# Patient Record
Sex: Male | Born: 1970 | Race: Black or African American | Hispanic: No | Marital: Married | Smoking: Current some day smoker
Health system: Southern US, Community
[De-identification: ages and names within clinical notes are randomized; demographics above are authoritative.]

## PROBLEM LIST (undated history)

## (undated) DIAGNOSIS — K279 Peptic ulcer, site unspecified, unspecified as acute or chronic, without hemorrhage or perforation: Secondary | ICD-10-CM

## (undated) DIAGNOSIS — K861 Other chronic pancreatitis: Secondary | ICD-10-CM

## (undated) DIAGNOSIS — I8501 Esophageal varices with bleeding: Secondary | ICD-10-CM

## (undated) HISTORY — DX: Esophageal varices with bleeding: I85.01

## (undated) HISTORY — DX: Peptic ulcer, site unspecified, unspecified as acute or chronic, without hemorrhage or perforation: K27.9

## (undated) HISTORY — DX: Other chronic pancreatitis: K86.1

---

## 2000-02-02 ENCOUNTER — Emergency Department (HOSPITAL_COMMUNITY): Admission: EM | Admit: 2000-02-02 | Discharge: 2000-02-02 | Payer: Self-pay | Admitting: Emergency Medicine

## 2000-02-02 ENCOUNTER — Encounter: Payer: Self-pay | Admitting: Emergency Medicine

## 2000-09-25 ENCOUNTER — Emergency Department (HOSPITAL_COMMUNITY): Admission: EM | Admit: 2000-09-25 | Discharge: 2000-09-25 | Payer: Self-pay | Admitting: Emergency Medicine

## 2000-09-25 ENCOUNTER — Encounter: Payer: Self-pay | Admitting: Emergency Medicine

## 2000-10-19 ENCOUNTER — Emergency Department (HOSPITAL_COMMUNITY): Admission: EM | Admit: 2000-10-19 | Discharge: 2000-10-19 | Payer: Self-pay | Admitting: Emergency Medicine

## 2002-01-19 ENCOUNTER — Encounter: Payer: Self-pay | Admitting: Emergency Medicine

## 2002-01-19 ENCOUNTER — Emergency Department (HOSPITAL_COMMUNITY): Admission: EM | Admit: 2002-01-19 | Discharge: 2002-01-19 | Payer: Self-pay | Admitting: Emergency Medicine

## 2002-02-06 ENCOUNTER — Inpatient Hospital Stay (HOSPITAL_COMMUNITY): Admission: EM | Admit: 2002-02-06 | Discharge: 2002-02-12 | Payer: Self-pay | Admitting: Psychiatry

## 2002-03-07 ENCOUNTER — Emergency Department (HOSPITAL_COMMUNITY): Admission: EM | Admit: 2002-03-07 | Discharge: 2002-03-08 | Payer: Self-pay | Admitting: Emergency Medicine

## 2003-03-18 ENCOUNTER — Encounter: Payer: Self-pay | Admitting: *Deleted

## 2003-03-19 ENCOUNTER — Inpatient Hospital Stay (HOSPITAL_COMMUNITY): Admission: AD | Admit: 2003-03-19 | Discharge: 2003-03-24 | Payer: Self-pay | Admitting: General Surgery

## 2005-02-23 ENCOUNTER — Encounter: Admission: RE | Admit: 2005-02-23 | Discharge: 2005-02-23 | Payer: Self-pay | Admitting: Occupational Medicine

## 2005-03-14 ENCOUNTER — Ambulatory Visit (HOSPITAL_COMMUNITY): Admission: RE | Admit: 2005-03-14 | Discharge: 2005-03-14 | Payer: Self-pay | Admitting: Orthopedic Surgery

## 2005-05-07 ENCOUNTER — Emergency Department (HOSPITAL_COMMUNITY): Admission: EM | Admit: 2005-05-07 | Discharge: 2005-05-07 | Payer: Self-pay | Admitting: Family Medicine

## 2006-02-24 ENCOUNTER — Emergency Department (HOSPITAL_COMMUNITY): Admission: EM | Admit: 2006-02-24 | Discharge: 2006-02-24 | Payer: Self-pay | Admitting: Emergency Medicine

## 2011-09-27 ENCOUNTER — Emergency Department (HOSPITAL_COMMUNITY): Payer: Self-pay

## 2011-09-27 ENCOUNTER — Encounter (HOSPITAL_COMMUNITY): Payer: Self-pay | Admitting: *Deleted

## 2011-09-27 ENCOUNTER — Emergency Department (HOSPITAL_COMMUNITY)
Admission: EM | Admit: 2011-09-27 | Discharge: 2011-09-27 | Disposition: A | Discharge status: Home / Self Care | Payer: Self-pay | Attending: Emergency Medicine | Admitting: Emergency Medicine

## 2011-09-27 DIAGNOSIS — R51 Headache: Secondary | ICD-10-CM | POA: Insufficient documentation

## 2011-09-27 DIAGNOSIS — M25519 Pain in unspecified shoulder: Secondary | ICD-10-CM | POA: Insufficient documentation

## 2011-09-27 DIAGNOSIS — M542 Cervicalgia: Secondary | ICD-10-CM | POA: Insufficient documentation

## 2011-09-27 DIAGNOSIS — R079 Chest pain, unspecified: Secondary | ICD-10-CM | POA: Insufficient documentation

## 2011-09-27 DIAGNOSIS — Y92410 Unspecified street and highway as the place of occurrence of the external cause: Secondary | ICD-10-CM | POA: Insufficient documentation

## 2011-09-27 DIAGNOSIS — T07XXXA Unspecified multiple injuries, initial encounter: Secondary | ICD-10-CM

## 2011-09-27 MED ORDER — OXYCODONE-ACETAMINOPHEN 5-325 MG PO TABS: 1.0000 | ORAL_TABLET | ORAL | Status: AC | PRN

## 2011-09-27 MED ORDER — CYCLOBENZAPRINE HCL 10 MG PO TABS: 10.0000 mg | ORAL_TABLET | Freq: Three times a day (TID) | ORAL | Status: AC | PRN

## 2011-09-27 MED ORDER — OXYCODONE-ACETAMINOPHEN 5-325 MG PO TABS
1.0000 | ORAL_TABLET | Freq: Once | ORAL | Status: AC
  Administered 2011-09-27: 1 via ORAL
  Filled 2011-09-27: qty 1

## 2011-09-27 NOTE — ED Notes (Signed)
Pt requesting rx for muscle relaxant, will speak with EDP
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
## 2012-09-01 ENCOUNTER — Emergency Department (HOSPITAL_COMMUNITY)
Admission: EM | Admit: 2012-09-01 | Discharge: 2012-09-01 | Disposition: A | Discharge status: Home / Self Care | Payer: No Typology Code available for payment source | Attending: Emergency Medicine | Admitting: Emergency Medicine

## 2012-09-01 ENCOUNTER — Emergency Department (HOSPITAL_COMMUNITY): Payer: No Typology Code available for payment source

## 2012-09-01 ENCOUNTER — Encounter (HOSPITAL_COMMUNITY): Payer: Self-pay | Admitting: *Deleted

## 2012-09-01 DIAGNOSIS — S4980XA Other specified injuries of shoulder and upper arm, unspecified arm, initial encounter: Secondary | ICD-10-CM | POA: Insufficient documentation

## 2012-09-01 DIAGNOSIS — R209 Unspecified disturbances of skin sensation: Secondary | ICD-10-CM | POA: Insufficient documentation

## 2012-09-01 DIAGNOSIS — S139XXA Sprain of joints and ligaments of unspecified parts of neck, initial encounter: Secondary | ICD-10-CM | POA: Insufficient documentation

## 2012-09-01 DIAGNOSIS — IMO0002 Reserved for concepts with insufficient information to code with codable children: Secondary | ICD-10-CM | POA: Insufficient documentation

## 2012-09-01 DIAGNOSIS — Y9389 Activity, other specified: Secondary | ICD-10-CM | POA: Insufficient documentation

## 2012-09-01 DIAGNOSIS — S46909A Unspecified injury of unspecified muscle, fascia and tendon at shoulder and upper arm level, unspecified arm, initial encounter: Secondary | ICD-10-CM | POA: Insufficient documentation

## 2012-09-01 DIAGNOSIS — S161XXD Strain of muscle, fascia and tendon at neck level, subsequent encounter: Secondary | ICD-10-CM

## 2012-09-01 DIAGNOSIS — Y92410 Unspecified street and highway as the place of occurrence of the external cause: Secondary | ICD-10-CM | POA: Insufficient documentation

## 2012-09-01 DIAGNOSIS — F17200 Nicotine dependence, unspecified, uncomplicated: Secondary | ICD-10-CM | POA: Insufficient documentation

## 2012-09-01 MED ORDER — NAPROXEN 500 MG PO TABS: 500.0000 mg | ORAL_TABLET | Freq: Two times a day (BID) | ORAL | Status: DC

## 2012-09-01 MED ORDER — OXYCODONE-ACETAMINOPHEN 5-325 MG PO TABS: 1.0000 | ORAL_TABLET | ORAL | Status: DC | PRN
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
## 2012-09-01 NOTE — ED Notes (Signed)
Pt placed in cervical collar in triage for severe posterior neck pain

## 2013-01-04 ENCOUNTER — Emergency Department (HOSPITAL_COMMUNITY): Payer: Self-pay

## 2013-01-04 ENCOUNTER — Encounter (HOSPITAL_COMMUNITY): Payer: Self-pay | Admitting: *Deleted

## 2013-01-04 ENCOUNTER — Emergency Department (HOSPITAL_COMMUNITY)
Admission: EM | Admit: 2013-01-04 | Discharge: 2013-01-04 | Disposition: A | Discharge status: Home / Self Care | Payer: Self-pay | Attending: Emergency Medicine | Admitting: Emergency Medicine

## 2013-01-04 DIAGNOSIS — Z79899 Other long term (current) drug therapy: Secondary | ICD-10-CM | POA: Insufficient documentation

## 2013-01-04 DIAGNOSIS — F17200 Nicotine dependence, unspecified, uncomplicated: Secondary | ICD-10-CM | POA: Insufficient documentation

## 2013-01-04 DIAGNOSIS — IMO0002 Reserved for concepts with insufficient information to code with codable children: Secondary | ICD-10-CM | POA: Insufficient documentation

## 2013-01-04 DIAGNOSIS — S62609A Fracture of unspecified phalanx of unspecified finger, initial encounter for closed fracture: Secondary | ICD-10-CM

## 2013-01-04 MED ORDER — ACETAMINOPHEN 500 MG PO TABS: 500.0000 mg | ORAL_TABLET | Freq: Four times a day (QID) | ORAL | Status: DC | PRN
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
## 2013-01-04 NOTE — Discharge Instructions (Signed)
Finger Fracture  A finger fracture is when one or more bones in the finger break.   HOME CARE    Wear the splint, tape, or cast as long as told by your doctor.   Keep your fingers in the position your doctor tell you to.   Raise (elevate) the injured area above the level of the heart.   Only take medicine as told by your doctor.   Put ice on the injured area.   Put ice in a plastic bag.   Place a towel between the skin and the bag.   Leave the ice on for 15-20 minutes, 3-4 times a day.   Follow up with your doctor.   Ask what exercises you can do when the splint comes off.  GET HELP RIGHT AWAY IF:    The fingernails are white or bluish.   You have pain not helped by medicine.   You cannot move your fingertips.   You lose feeling (numbness) in the injured finger(s).  MAKE SURE YOU:    Understand these instructions.   Will watch this condition.   Will get help right away if you are not doing well or get worse.  Document Released: 11/28/2007 Document Revised: 09/03/2011 Document Reviewed: 11/28/2007  [employer] Patient Information 2014 [employer]

## 2013-01-06 NOTE — ED Provider Notes (Signed)
Medical screening examination/treatment/procedure(s) were performed by non-physician practitioner and as supervising physician I was immediately available for consultation/collaboration.  Derwood Kaplan, MD 01/06/13 [phone]

## 2015-05-17 ENCOUNTER — Encounter: Payer: Self-pay | Admitting: Internal Medicine

## 2015-05-17 ENCOUNTER — Ambulatory Visit (INDEPENDENT_AMBULATORY_CARE_PROVIDER_SITE_OTHER): Payer: Self-pay | Admitting: Internal Medicine

## 2015-05-17 VITALS — BP 119/76 | HR 76 | Temp 98.0°F | Ht 73.0 in | Wt 186.9 lb

## 2015-05-17 DIAGNOSIS — R1084 Generalized abdominal pain: Secondary | ICD-10-CM

## 2015-05-17 DIAGNOSIS — Z7289 Other problems related to lifestyle: Secondary | ICD-10-CM

## 2015-05-17 DIAGNOSIS — Z789 Other specified health status: Secondary | ICD-10-CM | POA: Insufficient documentation

## 2015-05-17 DIAGNOSIS — Z720 Tobacco use: Secondary | ICD-10-CM

## 2015-05-17 DIAGNOSIS — K279 Peptic ulcer, site unspecified, unspecified as acute or chronic, without hemorrhage or perforation: Secondary | ICD-10-CM

## 2015-05-17 DIAGNOSIS — F1020 Alcohol dependence, uncomplicated: Secondary | ICD-10-CM

## 2015-05-17 MED ORDER — SUCRALFATE 1 GM/10ML PO SUSP: 1.0000 g | Freq: Three times a day (TID) | ORAL | Status: DC

## 2015-05-17 MED ORDER — OMEPRAZOLE 40 MG PO CPDR: 40.0000 mg | DELAYED_RELEASE_CAPSULE | Freq: Every day | ORAL | Status: DC

## 2015-05-17 NOTE — Patient Instructions (Addendum)
Your symptoms are concerning for a stomach ulcer or irritation with bleeding. Please take omeprazole 40 mg daily and sucralfate (10 mL) 4 times a day to heal the ulcer. I would also advise cutting back on alcohol and tobacco use. We have referred you to a gastroenterologist and will call you with that appointment time. I will also call you with your lab results and if we need to do any further testing right now.   Peptic Ulcer A peptic ulcer is a sore in the lining of your esophagus (esophageal ulcer), stomach (gastric ulcer), or in the first part of your small intestine (duodenal ulcer). The ulcer causes erosion into the deeper tissue. CAUSES  Normally, the lining of the stomach and the small intestine protects itself from the acid that digests food. The protective lining can be damaged by:  An infection caused by a bacterium called Helicobacter pylori (H. pylori).  Regular use of nonsteroidal anti-inflammatory drugs (NSAIDs), such as ibuprofen or aspirin.  Smoking tobacco. Other risk factors include being older than 50, drinking alcohol excessively, and having a family history of ulcer disease.  SYMPTOMS   Burning pain or gnawing in the area between the chest and the belly button.  Heartburn.  Nausea and vomiting.  Bloating. The pain can be worse on an empty stomach and at night. If the ulcer results in bleeding, it can cause:  Black, tarry stools.  Vomiting of bright red blood.  Vomiting of coffee-ground-looking materials. DIAGNOSIS  A diagnosis is usually made based upon your history and an exam. Other tests and procedures may be performed to find the cause of the ulcer. Finding a cause will help determine the best treatment. Tests and procedures may include:  Blood tests, stool tests, or breath tests to check for the bacterium H. pylori.  An upper gastrointestinal (GI) series of the esophagus, stomach, and small intestine.  An endoscopy to examine the esophagus, stomach, and  small intestine.  A biopsy. TREATMENT  Treatment may include:  Eliminating the cause of the ulcer, such as smoking, NSAIDs, or alcohol.  Medicines to reduce the amount of acid in your digestive tract.  Antibiotic medicines if the ulcer is caused by the H. pylori bacterium.  An upper endoscopy to treat a bleeding ulcer.  Surgery if the bleeding is severe or if the ulcer created a hole somewhere in the digestive system. HOME CARE INSTRUCTIONS   Avoid tobacco, alcohol, and caffeine. Smoking can increase the acid in the stomach, and continued smoking will impair the healing of ulcers.  Avoid foods and drinks that seem to cause discomfort or aggravate your ulcer.  Only take medicines as directed by your caregiver. Do not substitute over-the-counter medicines for prescription medicines without talking to your caregiver.  Keep any follow-up appointments and tests as directed. SEEK MEDICAL CARE IF:   Your do not improve within 7 days of starting treatment.  You have ongoing indigestion or heartburn. SEEK IMMEDIATE MEDICAL CARE IF:   You have sudden, sharp, or persistent abdominal pain.  You have bloody or dark black, tarry stools.  You vomit blood or vomit that looks like coffee grounds.  You become light-headed, weak, or feel faint.  You become sweaty or clammy. MAKE SURE YOU:   Understand these instructions.  Will watch your condition.  Will get help right away if you are not doing well or get worse.   This information is not intended to replace advice given to you by your health care provider. Make sure  you discuss any questions you have with your health care provider.   Document Released: 06/08/2000 Document Revised: 07/02/2014 Document Reviewed: 01/09/2012 Elsevier Interactive Patient Education [employer]

## 2015-05-17 NOTE — Assessment & Plan Note (Signed)
Patient admits to 6 pack of beer per day for 25 years. Denies needing an eye opener. Does not feel he is dependent on alcohol.  Plan: -Counseled on cutting back to 2 drinks per day

## 2015-05-17 NOTE — Assessment & Plan Note (Signed)
  Assessment: Progress toward smoking cessation:   Stagnant Barriers to progress toward smoking cessation:   Desire Comments: 1/2 ppd for 30 years  Plan: Instruction/counseling given:  I counseled patient on the dangers of tobacco use, advised patient to stop smoking, and reviewed strategies to maximize success. Medications to assist with smoking cessation:  None Patient agreed to the following self-care plans for smoking cessation: cut down the number of cigarettes smoked

## 2015-05-17 NOTE — Progress Notes (Signed)
   Subjective:    Patient ID: Roger Monroe, male    DOB: 1971-01-07, 44 y.o.   MRN: [id number]  HPI Roger Monroe is a 44 y.o. male with PMHx of tobacco abuse, alcohol abuse, GERD who presents to the clinic for abdominal pain. Please see A&P for the status of the patient's chronic medical problems.   No past medical history on file.  Outpatient Encounter Prescriptions as of 05/17/2015  Medication Sig  . acetaminophen (TYLENOL) 500 MG tablet Take 1 tablet (500 mg total) by mouth every 6 (six) hours as needed for pain.  . IRON PO Take 1 tablet by mouth daily.  . Melatonin 5 MG TABS Take 1 tablet by mouth at bedtime as needed. For sleep  . Multiple Vitamins-Minerals (MULTIVITAMIN WITH MINERALS) tablet Take 1 tablet by mouth daily.   No facility-administered encounter medications on file as of 05/17/2015.    No family history on file.  Social History   Social History  . Marital Status: Single    Spouse Name: N/A  . Number of Children: N/A  . Years of Education: N/A   Occupational History  . Not on file.   Social History Main Topics  . Smoking status: Current Every Day Smoker -- 0.50 packs/day for 30 years    Types: Cigarettes  . Smokeless tobacco: Not on file  . Alcohol Use: 0.0 oz/week    0 Standard drinks or equivalent per week     Comment: Beer.  . Drug Use: Yes     Comment: Marijuana.  . Sexual Activity: Not on file   Other Topics Concern  . Not on file   Social History Narrative   Review of Systems General: Admits to night sweats, denies weight loss. Denies fever, chills, fatigue, change in appetite.  Respiratory: Denies SOB, cough, DOE.   Cardiovascular: Denies chest pain and palpitations.  Gastrointestinal: Admits to nausea, vomiting, GERD, abdominal pain, hematemesis, BRBPR. Denies diarrhea, constipation, black tarry stools and abdominal distention.  Musculoskeletal: Denies myalgias, back pain.  Skin: Denies skin yellowing, rash.  Neurological: Denies  dizziness, headaches, weakness, lightheadedness     Objective:   Physical Exam Filed Vitals:   05/17/15 0826  BP: 119/76  Pulse: 76  Temp: 98 F (36.7 C)  TempSrc: Oral  Height: 6\' 1"  (1.854 m)  Weight: 186 lb 14.4 oz (84.777 kg)  SpO2: 98%   General: Vital signs reviewed.  Patient is well-developed and well-nourished, in no acute distress and cooperative with exam.  HEENT: No evidence of scleral icterus, no cervical lymphadenopathy, normal posterior oropharynx. Cardiovascular: RRR, S1 normal, S2 normal, no murmurs, gallops, or rubs. Pulmonary/Chest: Clear to auscultation bilaterally, no wheezes, rales, or rhonchi. Abdominal: Soft, mildly tender in all four quadrants, worst in epigastric/periumbilical region, non-distended, BS +, no masses, organomegaly, or guarding present.  Extremities: No lower extremity edema bilaterally Neurological: A&O x3 Skin: Warm, dry and intact. No evidence of juandice.  Psychiatric: Normal mood and affect. speech and behavior is normal. Cognition and memory are normal.      Assessment & Plan:   Please see problem based assessment and plan.

## 2015-05-17 NOTE — Assessment & Plan Note (Addendum)
Patient presents with a 2 year history of intermittent abdominal pain. Pain is diffuse, but located primarily in the epigastric, RUQ and LUQ regions. Patient experiences this "cramp-y", sharp pain twice a week, every week and the episodes seem to last for about one day each time. Pain radiates to his back and left side. During the episodes, he tries to "walk off" the pain or lay down. He has tried tylenol in the past without relief. The pain will resolve on its own each time. He hasn't noticed anything that make the pain worse or brings the pain on. "It just happens." Pain is not relieved or increased with food intake. He has been able to eat normally. At its worst, pain is a 10/10, but when it resolves, he is a 0/10. This pain is associated with nausea and vomiting. He vomits 2-3x/week, every week. He will occasional notice a good amount of blood, but this has only happened 3-4 times in the last 2 years. The last time he experienced this was 2 weeks ago. He is unable to quantify how much blood. Pain is not associated with diarrhea or constipation, but he does occasionally see BRBPR. He denies any black, dark or tarry stools. ROS is also positive for night sweats without weight loss and acid reflux. He denies skin or eye yellowing, dysphagia. Of note, he smoked 1/2 ppd for the last 30 years and drinks a 6 pack per day of beer for the last 25 years. He denies NSAID use. He has used cocaine in the past and smokes marijuana occasionally.   Differential diagnosis includes PUD with bleeding, erosive gastritis, erosive esophagitis, chronic pancreatitis, and esophageal varices with bleeding. Risk factors include uncontrolled GERD, tobacco and alcohol abuse. Malignancy should be on the differential given night sweats.   Plan: -Omeprazole 40 mg daily -Sucralfate 10 mL QID -Referral to GI for EGD -CBC, CMET, Lipase -Consider further imaging with abdominal US based on above results -Avoid NSAIDs, cut back on alcohol  and tobacco use  Addendum: -CMET and Lipase normal -Hemoglobin 16.1 -WBC mildly elevated at 12

## 2015-05-18 LAB — CBC
Hematocrit: 48.8 % (ref 37.5–51.0)
Hemoglobin: 16.1 g/dL (ref 12.6–17.7)
MCH: 28.8 pg (ref 26.6–33.0)
MCHC: 33 g/dL (ref 31.5–35.7)
MCV: 87 fL (ref 79–97)
Platelets: 340 10*3/uL (ref 150–379)
RBC: 5.6 x10E6/uL (ref 4.14–5.80)
RDW: 15.4 % (ref 12.3–15.4)
WBC: 12.8 10*3/uL — ABNORMAL HIGH (ref 3.4–10.8)

## 2015-05-18 LAB — CMP14 + ANION GAP
ALT: 14 IU/L (ref 0–44)
AST: 14 IU/L (ref 0–40)
Albumin/Globulin Ratio: 1.6 (ref 1.1–2.5)
Albumin: 4.1 g/dL (ref 3.5–5.5)
Alkaline Phosphatase: 66 IU/L (ref 39–117)
Anion Gap: 15 mmol/L (ref 10.0–18.0)
BUN/Creatinine Ratio: 9 (ref 9–20)
BUN: 9 mg/dL (ref 6–24)
Bilirubin Total: 0.3 mg/dL (ref 0.0–1.2)
CO2: 24 mmol/L (ref 18–29)
Calcium: 9.5 mg/dL (ref 8.7–10.2)
Chloride: 103 mmol/L (ref 97–106)
Creatinine, Ser: 1.02 mg/dL (ref 0.76–1.27)
GFR calc Af Amer: 101 mL/min/{1.73_m2} (ref >59)
GFR calc non Af Amer: 88 mL/min/{1.73_m2} (ref >59)
Globulin, Total: 2.5 g/dL (ref 1.5–4.5)
Glucose: 88 mg/dL (ref 65–99)
Potassium: 4.8 mmol/L (ref 3.5–5.2)
Sodium: 142 mmol/L (ref 136–144)
Total Protein: 6.6 g/dL (ref 6.0–8.5)

## 2015-05-18 LAB — LIPASE: Lipase: 31 U/L (ref 0–59)

## 2015-05-18 NOTE — Progress Notes (Signed)
Internal Medicine Clinic Attending  Case discussed with Dr. Richardson at the time of the visit.  We reviewed the resident's history and exam and pertinent patient test results.  I agree with the assessment, diagnosis, and plan of care documented in the resident's note. 

## 2015-06-14 ENCOUNTER — Encounter: Payer: Self-pay | Admitting: Internal Medicine

## 2015-06-14 ENCOUNTER — Ambulatory Visit: Payer: Self-pay | Admitting: Internal Medicine

## 2015-07-12 ENCOUNTER — Ambulatory Visit: Payer: Self-pay | Admitting: Internal Medicine

## 2015-07-13 NOTE — Addendum Note (Signed)
Addended by: Ilda Basset A on: 07/13/2015 11:39 AM   Modules accepted: Orders

## 2018-10-01 ENCOUNTER — Encounter (HOSPITAL_BASED_OUTPATIENT_CLINIC_OR_DEPARTMENT_OTHER): Payer: Self-pay | Admitting: *Deleted

## 2018-10-01 ENCOUNTER — Other Ambulatory Visit: Payer: Self-pay

## 2018-10-01 ENCOUNTER — Emergency Department (HOSPITAL_BASED_OUTPATIENT_CLINIC_OR_DEPARTMENT_OTHER): Payer: Self-pay

## 2018-10-01 ENCOUNTER — Emergency Department (HOSPITAL_BASED_OUTPATIENT_CLINIC_OR_DEPARTMENT_OTHER)
Admission: EM | Admit: 2018-10-01 | Discharge: 2018-10-01 | Disposition: A | Discharge status: Home / Self Care | Payer: Self-pay | Attending: Emergency Medicine | Admitting: Emergency Medicine

## 2018-10-01 DIAGNOSIS — Y92000 Kitchen of unspecified non-institutional (private) residence as  the place of occurrence of the external cause: Secondary | ICD-10-CM | POA: Insufficient documentation

## 2018-10-01 DIAGNOSIS — Z79899 Other long term (current) drug therapy: Secondary | ICD-10-CM | POA: Insufficient documentation

## 2018-10-01 DIAGNOSIS — Y999 Unspecified external cause status: Secondary | ICD-10-CM | POA: Insufficient documentation

## 2018-10-01 DIAGNOSIS — Z87891 Personal history of nicotine dependence: Secondary | ICD-10-CM | POA: Insufficient documentation

## 2018-10-01 DIAGNOSIS — S61211A Laceration without foreign body of left index finger without damage to nail, initial encounter: Secondary | ICD-10-CM | POA: Insufficient documentation

## 2018-10-01 DIAGNOSIS — W260XXA Contact with knife, initial encounter: Secondary | ICD-10-CM | POA: Insufficient documentation

## 2018-10-01 DIAGNOSIS — Y939 Activity, unspecified: Secondary | ICD-10-CM | POA: Insufficient documentation

## 2018-10-01 MED ORDER — LIDOCAINE HCL 2 % IJ SOLN
INTRAMUSCULAR | Status: AC
  Filled 2018-10-01: qty 20

## 2018-10-01 MED ORDER — ACETAMINOPHEN 325 MG PO TABS
650.0000 mg | ORAL_TABLET | Freq: Once | ORAL | Status: AC
  Administered 2018-10-01: 19:00:00 650 mg via ORAL
  Filled 2018-10-01: qty 2

## 2018-10-01 NOTE — Discharge Instructions (Addendum)
Please read instructions below.  Keep your wound clean and covered. In 24 hours, you can get your wound wet; gently clean it with soap and water, pat it dry, and reapply a clean bandage. You can take tylenol every 4 hours as needed for pain Follow up with your primary care or urgent care for wound recheck in 7 days.  Return to the ER for fever, pus draining from wound, redness, or new or worsening symptoms.

## 2018-10-01 NOTE — ED Provider Notes (Signed)
[facility] EMERGENCY DEPARTMENT Provider Note   CSN: [id number] Arrival date & time: 10/01/18  1804    History   Chief Complaint Chief Complaint  Patient presents with   Extremity Laceration    HPI Roger Monroe is a 48 y.o. male presenting to the emergency department with complaint of acute onset of laceration to left index finger that occurred about 5:30 this evening.  Patient states he was at work and was wiping a knife off with a towel, however the towel slipped and he cut the palmar aspect of his distal left index finger.    He has had some bleeding since that time and associated localized pain.  Denies history of immunocompromise or diabetes.  No other injuries.  Tetanus is up-to-date.  The history is provided by the patient.    Past Medical History:  Diagnosis Date   Chronic pancreatitis (HCC)    Esophageal varices with bleeding (HCC)    PUD (peptic ulcer disease)     Patient Active Problem List   Diagnosis Date Noted   PUD (peptic ulcer disease) 05/17/2015   Tobacco abuse 05/17/2015   Alcohol consumption of more than four drinks per day 05/17/2015    History reviewed. No pertinent surgical history.      Home Medications    Prior to Admission medications   Medication Sig Start Date End Date Taking? Authorizing Provider  acetaminophen (TYLENOL) 500 MG tablet Take 1 tablet (500 mg total) by mouth every 6 (six) hours as needed for pain. 01/04/13   Lurene ShadowPhelps, Erin O, PA-C  Melatonin 5 MG TABS Take 1 tablet by mouth at bedtime as needed. For sleep    [provider]  Multiple Vitamins-Minerals (MULTIVITAMIN WITH MINERALS) tablet Take 1 tablet by mouth daily.    [provider]  omeprazole (PRILOSEC) 40 MG capsule Take 1 capsule (40 mg total) by mouth daily. 05/17/15   Burns, Tinnie GensAlexa R, MD  sucralfate (CARAFATE) 1 GM/10ML suspension Take 10 mLs (1 g total) by mouth 4 (four) times daily -  with meals and at bedtime. 05/17/15   Servando SnareBurns, Alexa  R, MD    Family History History reviewed. No pertinent family history.  Social History Social History   Tobacco Use   Smoking status: Former Smoker    Packs/day: 0.50    Years: 30.00    Pack years: 15.00    Types: Cigarettes   Smokeless tobacco: Never Used  Substance Use Topics   Alcohol use: Yes    Alcohol/week: 4.0 standard drinks    Types: 4 Cans of beer per week    Comment: Beer.   Drug use: Yes    Types: Marijuana    Comment: Marijuana.     Allergies   Ibuprofen   Review of Systems Review of Systems  Musculoskeletal: Positive for myalgias.  Skin: Positive for wound.  Allergic/Immunologic: Negative for immunocompromised state.     Physical Exam Updated Vital Signs BP (!) 140/104 (BP Location: Right Arm)    Pulse 81    Temp 98.4 F (36.9 C) (Oral)    Resp 14    Ht 6' 1.5" (1.867 m)    Wt 94.8 kg    SpO2 99%    BMI 27.20 kg/m   Physical Exam Vitals signs and nursing note reviewed.  Constitutional:      General: He is not in acute distress.    Appearance: He is well-developed.  HENT:     Head: Normocephalic and atraumatic.  Eyes:  Conjunctiva/sclera: Conjunctivae normal.  Cardiovascular:     Rate and Rhythm: Normal rate.  Pulmonary:     Effort: Pulmonary effort is normal.  Musculoskeletal:     Comments: 3 cm laceration to the palmar aspect of the left distal phalanx of the index finger.  Wound is actively bleeding, however improved with direct pressure.  Wound is not grossly contaminated. Patient is able to flex DIP against resistance with good strength.  Distal digit is warm and well-perfused.    Neurological:     Mental Status: He is alert.  Psychiatric:        Mood and Affect: Mood normal.        Behavior: Behavior normal.      ED Treatments / Results  Labs (all labs ordered are listed, but only abnormal results are displayed) Labs Reviewed - No data to display  EKG None  Radiology Dg Finger Index Left  Result Date:  10/01/2018 CLINICAL DATA:  Laceration. EXAM: LEFT INDEX FINGER 2+V COMPARISON:  None. FINDINGS: There is no evidence of fracture or dislocation. Minor degenerative change of the proximal interphalangeal joint. A dressing overlies the volar radial aspect of the distal digit. There is no radiopaque foreign body or tracking soft tissue air. IMPRESSION: No radiopaque foreign body or acute osseous abnormality. Electronically Signed   By: Narda Rutherford M.D.   On: 10/01/2018 19:15    Procedures .Marland KitchenLaceration Repair Date/Time: 10/01/2018 8:21 PM Performed by: Lakesha Levinson, [place] N, PA-C Authorized by: Jeneane Pieczynski, [place] N, PA-C   Consent:    Consent obtained:  Verbal   Consent given by:  Patient   Risks discussed:  Infection, pain and poor cosmetic result   Alternatives discussed:  No treatment Anesthesia (see MAR for exact dosages):    Anesthesia method:  Nerve block   Block needle gauge:  25 G   Block anesthetic:  Lidocaine 2% w/o epi   Block injection procedure:  Anatomic landmarks palpated   Block outcome:  Anesthesia achieved Laceration details:    Location:  Finger   Finger location:  L index finger   Length (cm):  3 Repair type:    Repair type:  Simple Pre-procedure details:    Preparation:  Patient was prepped and draped in usual sterile fashion and imaging obtained to evaluate for foreign bodies Exploration:    Hemostasis achieved with:  Direct pressure   Wound exploration: wound explored through full range of motion and entire depth of wound probed and visualized     Wound extent: no foreign bodies/material noted, no tendon damage noted and no underlying fracture noted     Contaminated: no   Treatment:    Area cleansed with:  Saline   Amount of cleaning:  Standard   Visualized foreign bodies/material removed: no   Skin repair:    Repair method:  Sutures   Suture size:  5-0   Suture material:  Prolene   Suture technique:  Simple interrupted   Number of sutures:  7 Approximation:     Approximation:  Close Post-procedure details:    Dressing:  Non-adherent dressing and splint for protection   Patient tolerance of procedure:  Tolerated well, no immediate complications   (including critical care time)  Medications Ordered in ED Medications  lidocaine (XYLOCAINE) 2 % (with pres) injection (has no administration in time range)  acetaminophen (TYLENOL) tablet 650 mg (650 mg Oral Given 10/01/18 1850)     Initial Impression / Assessment and Plan / ED Course  I have reviewed the  triage vital signs and the nursing notes.  Pertinent labs & imaging results that were available during my care of the patient were reviewed by me and considered in my medical decision making (see chart for details).        Pt with laceration to the left index finger that occurred when he was cleaning a kitchen knife today. Preserved active and resistive ROM of the digit. Wound explored and base of wound visualized in a bloodless field without evidence of foreign body or tendon injury. Laceration occurred < 8 hours prior to repair which was well tolerated.  Tdap up-to-date.  Pt has no comorbidities to effect normal wound healing. Pt discharged  without antibiotics.  Discussed suture home care with patient and answered questions. Pt to follow-up for wound check and suture removal in 7 days; they are to return to the ED sooner for signs of infection. Pt is hemodynamically stable with no complaints prior to dc.   Discussed results, findings, treatment and follow up. Patient advised of return precautions. Patient verbalized understanding and agreed with plan.  Final Clinical Impressions(s) / ED Diagnoses   Final diagnoses:  Laceration of left index finger without foreign body without damage to nail, initial encounter    ED Discharge Orders    None       Remi Lopata, [place] N, PA-C 10/01/18 2023    Gwyneth Sprout, MD 10/01/18 2230

## 2018-10-01 NOTE — ED Triage Notes (Signed)
Pt /o lac to left index finger by kitchen knife x 30 mins ago

## 2018-10-08 ENCOUNTER — Encounter (HOSPITAL_BASED_OUTPATIENT_CLINIC_OR_DEPARTMENT_OTHER): Payer: Self-pay | Admitting: *Deleted

## 2018-10-08 ENCOUNTER — Emergency Department (HOSPITAL_BASED_OUTPATIENT_CLINIC_OR_DEPARTMENT_OTHER)
Admission: EM | Admit: 2018-10-08 | Discharge: 2018-10-08 | Disposition: A | Discharge status: Home / Self Care | Payer: Self-pay | Attending: Emergency Medicine | Admitting: Emergency Medicine

## 2018-10-08 ENCOUNTER — Other Ambulatory Visit: Payer: Self-pay

## 2018-10-08 DIAGNOSIS — Z87891 Personal history of nicotine dependence: Secondary | ICD-10-CM | POA: Insufficient documentation

## 2018-10-08 DIAGNOSIS — Z79899 Other long term (current) drug therapy: Secondary | ICD-10-CM | POA: Insufficient documentation

## 2018-10-08 DIAGNOSIS — Z4802 Encounter for removal of sutures: Secondary | ICD-10-CM | POA: Insufficient documentation

## 2018-10-08 NOTE — ED Triage Notes (Signed)
Pt here  for suture removal seen 4/8

## 2018-10-08 NOTE — ED Notes (Signed)
First contact with patient; self and role introduced.  No one at bedside with pt.  Pt with existing 2cm resolving lac to L index finger. In today to have sutures removed . Site CDI, skin well approximated with one edge slightly oozing. Provider in room assessed, removed sutures, cleaned area, and dressed finger.  Pt tol well.  RRR regular, even, and unlabored. Skin warm, dry, color appropriate. No visible s/s of skin breakdown at this time.  No apparent physical distress.  Provider to DC pt. Pt agreeable.  Bed is in low, locked position. Call light within reach.  All needs and concerns addressed prior to leaving room.

## 2018-10-08 NOTE — ED Provider Notes (Signed)
[facility] EMERGENCY DEPARTMENT Provider Note   CSN: [id number] Arrival date & time: 10/08/18  1511    History   Chief Complaint Chief Complaint  Patient presents with  . Suture / Staple Removal    HPI Roger Monroe is a 47 y.o. male.     HPI Patient here for suture removal.  Left index finger.  No problems with increased pain drainage or redness. Past Medical History:  Diagnosis Date  . Chronic pancreatitis (HCC)   . Esophageal varices with bleeding (HCC)   . PUD (peptic ulcer disease)     Patient Active Problem List   Diagnosis Date Noted  . PUD (peptic ulcer disease) 05/17/2015  . Tobacco abuse 05/17/2015  . Alcohol consumption of more than four drinks per day 05/17/2015    History reviewed. No pertinent surgical history.      Home Medications    Prior to Admission medications   Medication Sig Start Date End Date Taking? Authorizing Provider  acetaminophen (TYLENOL) 500 MG tablet Take 1 tablet (500 mg total) by mouth every 6 (six) hours as needed for pain. 01/04/13   Lurene Shadow, PA-C  Melatonin 5 MG TABS Take 1 tablet by mouth at bedtime as needed. For sleep    [provider]  Multiple Vitamins-Minerals (MULTIVITAMIN WITH MINERALS) tablet Take 1 tablet by mouth daily.    [provider]  omeprazole (PRILOSEC) 40 MG capsule Take 1 capsule (40 mg total) by mouth daily. 05/17/15   Burns, Tinnie Gens, MD  sucralfate (CARAFATE) 1 GM/10ML suspension Take 10 mLs (1 g total) by mouth 4 (four) times daily -  with meals and at bedtime. 05/17/15   Servando Snare, MD    Family History History reviewed. No pertinent family history.  Social History Social History   Tobacco Use  . Smoking status: Former Smoker    Packs/day: 0.50    Years: 30.00    Pack years: 15.00    Types: Cigarettes  . Smokeless tobacco: Never Used  Substance Use Topics  . Alcohol use: Yes    Alcohol/week: 4.0 standard drinks    Types: 4 Cans of beer per week   Comment: Beer.  . Drug use: Yes    Types: Marijuana    Comment: Marijuana.     Allergies   Ibuprofen   Review of Systems Review of Systems Constitutional: No fever chills malaise Musculoskeletal: No redness. swelling  Physical Exam Updated Vital Signs BP (!) 163/104   Pulse 99   Temp 98.6 F (37 C)   Resp 16   Ht 6\' 1"  (1.854 m)   Wt 94 kg   SpO2 99%   BMI 27.34 kg/m   Physical Exam Constitutional:      Comments: Alert and clinically well in appearance.  Pulmonary:     Effort: Pulmonary effort is normal.  Musculoskeletal: Normal range of motion.     Comments: Left index finger has semi-elliptical laceration on the pad with 7 sutures present.  Healing well without redness or swelling of the finger.  No drainage or discharge from the wound.  Lean dry and intact.  Skin:    General: Skin is warm and dry.  Neurological:     General: No focal deficit present.     Mental Status: He is oriented to person, place, and time.     Coordination: Coordination normal.  Psychiatric:        Mood and Affect: Mood normal.      ED Treatments /  Results  Labs (all labs ordered are listed, but only abnormal results are displayed) Labs Reviewed - No data to display  EKG None  Radiology No results found.  Procedures .Suture Removal Date/Time: 10/08/2018 3:32 PM Performed by: Arby BarrettePfeiffer, Laurinda Carreno, MD Authorized by: Arby BarrettePfeiffer, Carlis Blanchard, MD   Consent:    Consent obtained:  Verbal   Consent given by:  Patient   Risks discussed:  Bleeding, pain and wound separation Location:    Location:  Upper extremity   Upper extremity location:  Hand   Hand location:  L index finger Procedure details:    Wound appearance:  No signs of infection, good wound healing and clean   Number of sutures removed:  7 Post-procedure details:    Post-removal:  Antibiotic ointment applied and dressing applied   Patient tolerance of procedure:  Tolerated well, no immediate complications Comments:     Wound  is clean dry well approximated.  Approximately a 3 mm section with minimal separation and slight bleeding with removal of suture.  Appropriate and amenable to continue topical wound care and protection.   (including critical care time)  Medications Ordered in ED Medications - No data to display   Initial Impression / Assessment and Plan / ED Course  I have reviewed the triage vital signs and the nursing notes.  Pertinent labs & imaging results that were available during my care of the patient were reviewed by me and considered in my medical decision making (see chart for details).         Final Clinical Impressions(s) / ED Diagnoses   Final diagnoses:  Visit for suture removal    ED Discharge Orders    None       Arby BarrettePfeiffer, Sterling Ucci, MD 10/08/18 1533

## 2018-10-08 NOTE — ED Notes (Addendum)
Finger splint applied per orders.   Patient reviewed and given discharge instructions and work release note. Questions or concerns with discharge instructions addressed, no further questions noted. Verbalized understanding of instructions. Patient remains alert and oriented. Respirations even unlabored. No acute distress noted. No IV placed during this visit. All belongings with patient upon discharge. Patient walked to lobby at DC.

## 2018-10-08 NOTE — Discharge Instructions (Signed)
1.  Change your dressing daily and put antibiotic ointment on your wound.  Keep your wound covered and wear finger splint to protect the wound and minimize bending of the tip of your finger while it is still healing. 2.  Return to the emergency department if signs of infection such as pain swelling redness or drainage.

## 2019-03-27 ENCOUNTER — Other Ambulatory Visit: Payer: Self-pay

## 2019-03-27 DIAGNOSIS — Z20822 Contact with and (suspected) exposure to covid-19: Secondary | ICD-10-CM

## 2019-03-28 LAB — NOVEL CORONAVIRUS, NAA: SARS-CoV-2, NAA: NOT DETECTED

## 2019-11-04 DIAGNOSIS — I10 Essential (primary) hypertension: Secondary | ICD-10-CM | POA: Insufficient documentation

## 2020-01-28 IMAGING — CR LEFT INDEX FINGER 2+V
3 series · 3 of 3 positions shown · non-contrast
Comparison: None.

CLINICAL DATA: Laceration.

EXAM:
LEFT INDEX FINGER 2+V

[x finger pa left]
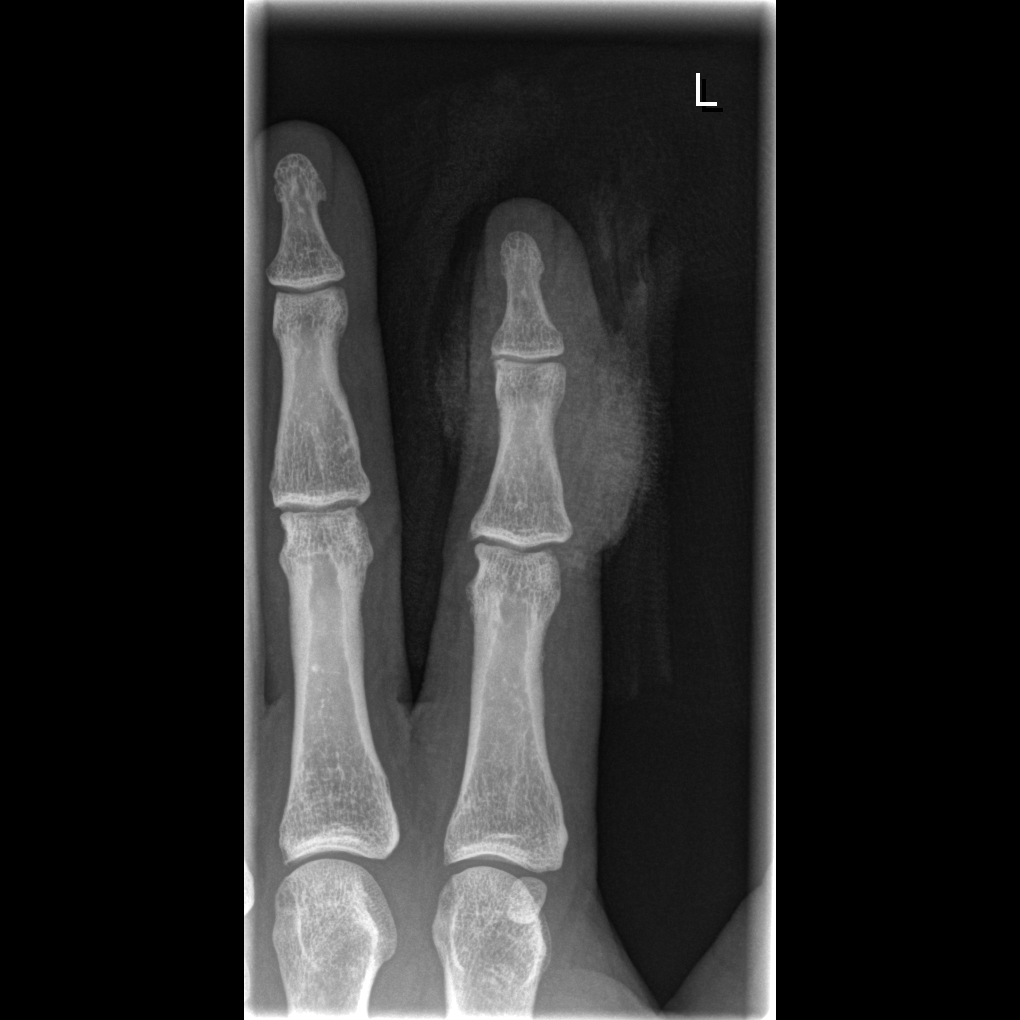

[x finger obl. left]
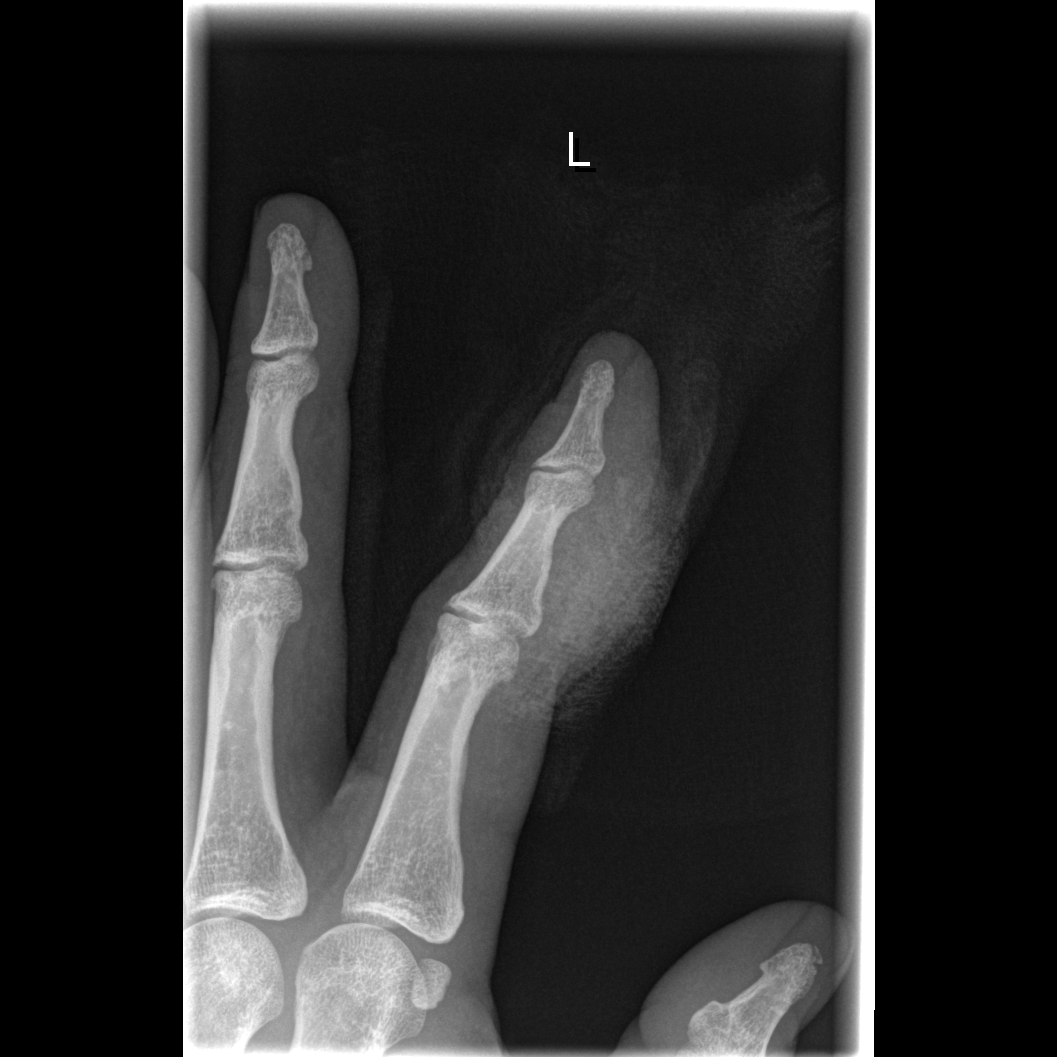

[x finger lateral left]
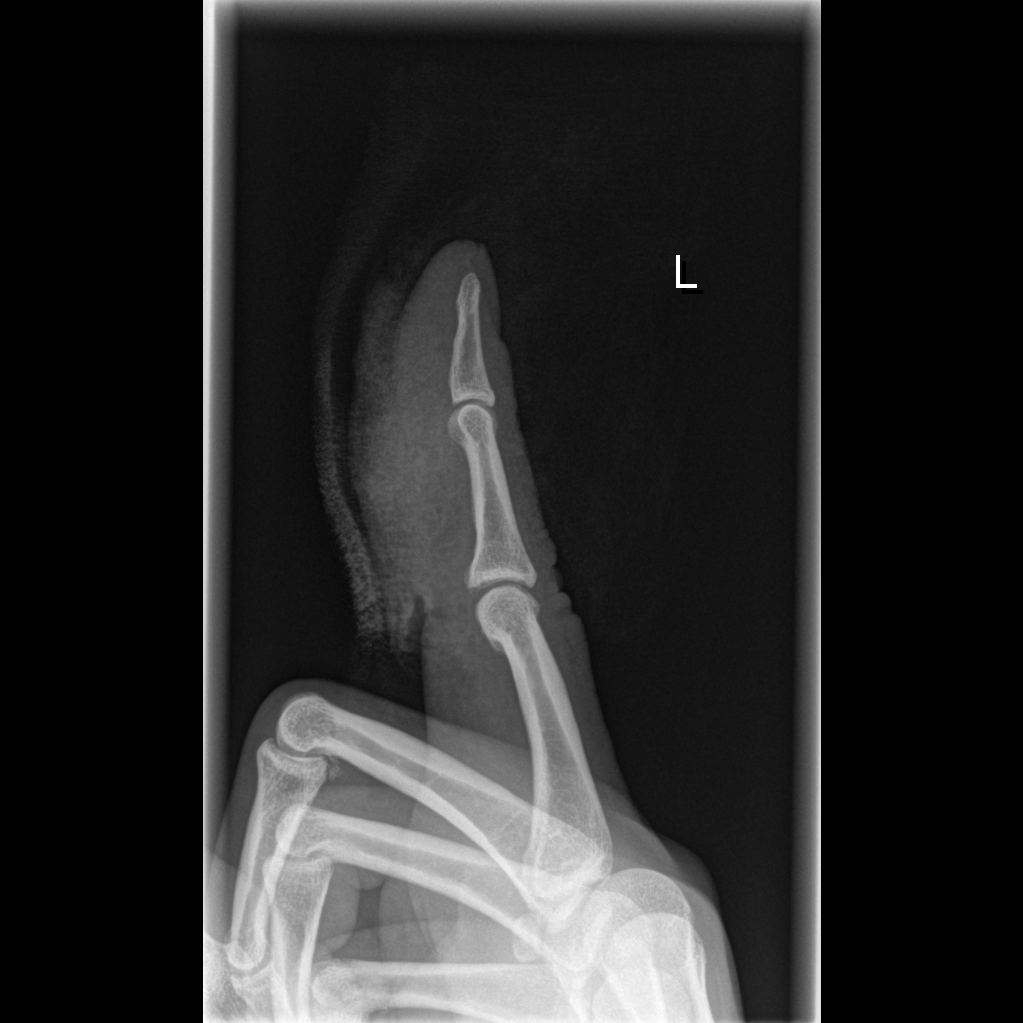

[3 of 3 positions shown; findings below may reference images not displayed]

FINDINGS: There is no evidence of fracture or dislocation. Minor degenerative
change of the proximal interphalangeal joint. A dressing overlies
the volar radial aspect of the distal digit. There is no radiopaque
foreign body or tracking soft tissue air.
IMPRESSION: No radiopaque foreign body or acute osseous abnormality.

## 2020-12-19 LAB — COLOGUARD: COLOGUARD: NEGATIVE

## 2020-12-19 LAB — EXTERNAL GENERIC LAB PROCEDURE: COLOGUARD: NEGATIVE

## 2022-01-05 DIAGNOSIS — E781 Pure hyperglyceridemia: Secondary | ICD-10-CM | POA: Insufficient documentation

## 2023-03-11 ENCOUNTER — Ambulatory Visit: Payer: BC Managed Care – PPO | Admitting: Internal Medicine

## 2023-03-11 ENCOUNTER — Encounter: Payer: Self-pay | Admitting: Internal Medicine

## 2023-03-11 VITALS — BP 132/88 | HR 88 | Temp 97.7°F | Ht 73.0 in | Wt 204.4 lb

## 2023-03-11 DIAGNOSIS — I10 Essential (primary) hypertension: Secondary | ICD-10-CM | POA: Diagnosis not present

## 2023-03-11 DIAGNOSIS — E785 Hyperlipidemia, unspecified: Secondary | ICD-10-CM

## 2023-03-11 MED ORDER — ROSUVASTATIN CALCIUM 10 MG PO TABS: 10.0000 mg | ORAL_TABLET | Freq: Every day | ORAL | 1 refills | Status: DC

## 2023-03-11 MED ORDER — LOSARTAN POTASSIUM 100 MG PO TABS
100.0000 mg | ORAL_TABLET | Freq: Every day | ORAL | 1 refills | Status: DC
Start: 2023-03-11 — End: 2023-04-25

## 2023-03-11 NOTE — Patient Instructions (Signed)
Check blood pressure daily:  Start taking Losartan 1/2 tablet once daily for blood pressure. If blood pressure is above 130/80 while taking the 1/2 tablet, increase and take 1 full tablet once daily.   Goal is blood pressure less than 130/80

## 2023-03-11 NOTE — Progress Notes (Signed)
[facility] [address] [place] [address] Dept: [phone] Dept Fax: [phone]  New Patient Office Visit  Subjective:   Roger Monroe [birth year] 03/11/2023  Chief Complaint  Patient presents with   Establish Care    Medication refill     HPI: Roger Monroe presents today to establish care at [facility]. Introduced to Publishing rights manager role and practice setting.  All questions answered.  Concerns: See below   HYPERTENSION: Roger Monroe presents for the medical management of hypertension.  Patient's current hypertension medication regimen is: Losartan 100mg   Patient is NOT currently taking prescribed medications for HTN.  Patient is  regularly keeping a check on BP at home.  Patient is  adhering to low salt diet.  No headache, dizziness, CP, SHOB, vision changes.  Has lost 10lbs since last PCP appt in 2023.  BP Readings from Last 3 Encounters:  03/11/23 132/88  10/08/18 (!) 163/104  10/01/18 (!) 140/104   HYPERLIPIDEMIA: Roger Monroe presents for the medical management of hyperlipidemia.  Patient's current HLD regimen is: Crestor 10mg   Patient is NOT currently taking prescribed medications for HLD.  Adhering to heathy diet: not always  Exercising regularly: yes - keeps active with work No results found for: "CHOL", "HDL", "LDLCALC", "LDLDIRECT", "TRIG", "CHOLHDL"   The following portions of the patient's history were reviewed and updated as appropriate: past medical history, past surgical history, family history, social history, allergies, medications, and problem list.   Patient Active Problem List   Diagnosis Date Noted   Hypertriglyceridemia 01/05/2022   Essential hypertension 11/04/2019   PUD (peptic ulcer disease) 05/17/2015   Tobacco abuse 05/17/2015   Past Medical History:  Diagnosis Date   Chronic pancreatitis (HCC)    Esophageal varices with bleeding (HCC)     PUD (peptic ulcer disease)    History reviewed. No pertinent surgical history. History reviewed. No pertinent family history.  Current Outpatient Medications:    acetaminophen (TYLENOL) 500 MG tablet, Take 1 tablet (500 mg total) by mouth every 6 (six) hours as needed for pain., Disp: 30 tablet, Rfl: 0   Melatonin 5 MG TABS, Take 1 tablet by mouth at bedtime as needed. For sleep, Disp: , Rfl:    Multiple Vitamins-Minerals (MULTIVITAMIN WITH MINERALS) tablet, Take 1 tablet by mouth daily., Disp: , Rfl:    losartan (COZAAR) 100 MG tablet, Take 1 tablet (100 mg total) by mouth daily., Disp: 90 tablet, Rfl: 1   rosuvastatin (CRESTOR) 10 MG tablet, Take 1 tablet (10 mg total) by mouth daily., Disp: 90 tablet, Rfl: 1 Allergies  Allergen Reactions   Ibuprofen     Constipation    ROS: A complete ROS was performed with pertinent positives/negatives noted in the HPI. The remainder of the ROS are negative.   Objective:   Today's Vitals   03/11/23 1404  BP: 132/88  Pulse: 88  Temp: 97.7 F (36.5 C)  TempSrc: Temporal  SpO2: 97%  Weight: 204 lb 6.4 oz (92.7 kg)  Height: 6\' 1"  (1.854 m)    GENERAL: Well-appearing, in NAD. Well nourished.  SKIN: Pink, warm and dry. No rash, lesion, ulceration, or ecchymoses.  NECK: Trachea midline. Full ROM w/o pain or tenderness. No lymphadenopathy.  RESPIRATORY: Chest wall symmetrical. Respirations even and non-labored. Breath sounds clear to auscultation bilaterally.  CARDIAC: S1, S2 present, regular rate and rhythm. Peripheral pulses 2+ bilaterally.  EXTREMITIES: Without clubbing, cyanosis, or edema.  NEUROLOGIC: No motor or sensory deficits. Steady,  even gait.  PSYCH/MENTAL STATUS: Alert, oriented x 3. Cooperative, appropriate mood and affect.   Health Maintenance Due  Topic Date Due   HIV Screening  Never done   Hepatitis C Screening  Never done   Colonoscopy  Never done    No results found for any visits on 03/11/23.  Assessment & Plan:    Primary hypertension -     CBC with Differential/Platelet; Future -     Comprehensive metabolic panel; Future -     TSH; Future -     Losartan Potassium; Take 1 tablet (100 mg total) by mouth daily.  Dispense: 90 tablet; Refill: 1  Hyperlipidemia, unspecified hyperlipidemia type -     Lipid panel; Future -     Rosuvastatin Calcium; Take 1 tablet (10 mg total) by mouth daily.  Dispense: 90 tablet; Refill: 1    Orders Placed This Encounter  Procedures   CBC with Differential/Platelet    Standing Status:   Future    Standing Expiration Date:   09/08/2023   Comprehensive metabolic panel    Standing Status:   Future    Standing Expiration Date:   09/08/2023   Lipid panel    Standing Status:   Future    Standing Expiration Date:   09/08/2023   TSH    Standing Status:   Future    Standing Expiration Date:   09/08/2023   Meds ordered this encounter  Medications   losartan (COZAAR) 100 MG tablet    Sig: Take 1 tablet (100 mg total) by mouth daily.    Dispense:  90 tablet    Refill:  1    Order Specific Question:   Supervising Provider    Answer:   Garnette Gunner [[id number]]   rosuvastatin (CRESTOR) 10 MG tablet    Sig: Take 1 tablet (10 mg total) by mouth daily.    Dispense:  90 tablet    Refill:  1    Order Specific Question:   Supervising Provider    Answer:   Garnette Gunner [[id number]]    Return in about 3 months (around 06/10/2023) for Fasting Annual Physical Exam.   Salvatore Decent, FNP

## 2023-03-19 ENCOUNTER — Other Ambulatory Visit (INDEPENDENT_AMBULATORY_CARE_PROVIDER_SITE_OTHER): Payer: BC Managed Care – PPO

## 2023-03-19 ENCOUNTER — Telehealth: Payer: Self-pay

## 2023-03-19 DIAGNOSIS — I10 Essential (primary) hypertension: Secondary | ICD-10-CM

## 2023-03-19 DIAGNOSIS — E785 Hyperlipidemia, unspecified: Secondary | ICD-10-CM

## 2023-03-19 LAB — CBC WITH DIFFERENTIAL/PLATELET
Basophils Absolute: 0.1 10*3/uL (ref 0.0–0.1)
Basophils Relative: 1 % (ref 0.0–3.0)
Eosinophils Absolute: 0.3 10*3/uL (ref 0.0–0.7)
Eosinophils Relative: 4.3 % (ref 0.0–5.0)
HCT: 47.2 % (ref 39.0–52.0)
Hemoglobin: 15.2 g/dL (ref 13.0–17.0)
Lymphocytes Relative: 37.3 % (ref 12.0–46.0)
Lymphs Abs: 2.6 10*3/uL (ref 0.7–4.0)
MCHC: 32.2 g/dL (ref 30.0–36.0)
MCV: 87.1 fl (ref 78.0–100.0)
Monocytes Absolute: 0.6 10*3/uL (ref 0.1–1.0)
Monocytes Relative: 8.1 % (ref 3.0–12.0)
Neutro Abs: 3.5 10*3/uL (ref 1.4–7.7)
Neutrophils Relative %: 49.3 % (ref 43.0–77.0)
Platelets: 310 10*3/uL (ref 150.0–400.0)
RBC: 5.42 Mil/uL (ref 4.22–5.81)
RDW: 14.4 % (ref 11.5–15.5)
WBC: 7 10*3/uL (ref 4.0–10.5)

## 2023-03-19 LAB — LIPID PANEL
Cholesterol: 200 mg/dL (ref 0–200)
HDL: 61.4 mg/dL (ref >39.00)
Total CHOL/HDL Ratio: 3
Triglycerides: 727 mg/dL — ABNORMAL HIGH (ref 0.0–149.0)

## 2023-03-19 LAB — COMPREHENSIVE METABOLIC PANEL
ALT: 18 U/L (ref 0–53)
AST: 17 U/L (ref 0–37)
Albumin: 4 g/dL (ref 3.5–5.2)
Alkaline Phosphatase: 60 U/L (ref 39–117)
BUN: 18 mg/dL (ref 6–23)
CO2: 24 mEq/L (ref 19–32)
Calcium: 9.4 mg/dL (ref 8.4–10.5)
Chloride: 105 mEq/L (ref 96–112)
Creatinine, Ser: 1.27 mg/dL (ref 0.40–1.50)
GFR: 64.28 mL/min (ref >60.00)
Glucose, Bld: 89 mg/dL (ref 70–99)
Potassium: 4.2 mEq/L (ref 3.5–5.1)
Sodium: 140 mEq/L (ref 135–145)
Total Bilirubin: 0.3 mg/dL (ref 0.2–1.2)
Total Protein: 6.7 g/dL (ref 6.0–8.3)

## 2023-03-19 LAB — LDL CHOLESTEROL, DIRECT: Direct LDL: 73 mg/dL

## 2023-03-19 LAB — TSH: TSH: 1.63 u[IU]/mL (ref 0.35–5.50)

## 2023-03-19 NOTE — Telephone Encounter (Signed)
LVM for pt with the update from the pharmacy. See message below.

## 2023-03-19 NOTE — Telephone Encounter (Signed)
Spoke with pharmacy. Medication is ready, however no insurance on file for the pt, resulting in an extremely high cost for the medication.

## 2023-03-19 NOTE — Telephone Encounter (Signed)
Prescription Request  03/19/2023  LOV: 03/11/23  What is the name of the medication or equipment? rosuvastatin (CRESTOR) 10 MG tablet   Have you contacted your pharmacy to request a refill? Yes   Which pharmacy would you like this sent to?  [id number] - [place] - [address] [place] [address] Phone: [phone] Fax: [phone]    Pt here for a lab appt and inquiring about this med. States the pharmacy will not give him the medication. Pharmacy does not open until 9:00. Will contact pharm during operating hours to inquire what the issue is.  Please advise at Mobile [phone] (mobile)

## 2023-03-25 ENCOUNTER — Telehealth: Payer: Self-pay | Admitting: Internal Medicine

## 2023-03-25 DIAGNOSIS — E785 Hyperlipidemia, unspecified: Secondary | ICD-10-CM

## 2023-03-25 MED ORDER — ROSUVASTATIN CALCIUM 10 MG PO TABS: 10.0000 mg | ORAL_TABLET | Freq: Every day | ORAL | 1 refills | Status: AC

## 2023-03-25 NOTE — Telephone Encounter (Signed)
Prescription Request  03/25/2023  LOV: 03/11/2023  What is the name of the medication or equipment? rosuvastatin (CRESTOR) 10 MG tablet [[id number]]   Have you contacted your pharmacy to request a refill? No   Which pharmacy would you like this sent to?     [facility] [address] [place], [place] - [address] AT [facility] [address] [place]. [place] [address] Phone: [phone] Fax: [phone]   Patient notified that their request is being sent to the clinical staff for review and that they should receive a response within 2 business days.   Please advise at Mobile [phone] (mobile)

## 2023-03-25 NOTE — Telephone Encounter (Signed)
Rx sent to pharmacy   

## 2023-03-29 ENCOUNTER — Other Ambulatory Visit: Payer: Self-pay | Admitting: Internal Medicine

## 2023-03-29 DIAGNOSIS — Z1212 Encounter for screening for malignant neoplasm of rectum: Secondary | ICD-10-CM

## 2023-03-29 DIAGNOSIS — Z1211 Encounter for screening for malignant neoplasm of colon: Secondary | ICD-10-CM

## 2023-04-25 ENCOUNTER — Telehealth: Payer: Self-pay | Admitting: Internal Medicine

## 2023-04-25 DIAGNOSIS — I10 Essential (primary) hypertension: Secondary | ICD-10-CM

## 2023-04-25 MED ORDER — LOSARTAN POTASSIUM 100 MG PO TABS: 100.0000 mg | ORAL_TABLET | Freq: Every day | ORAL | 1 refills | Status: DC

## 2023-04-25 NOTE — Telephone Encounter (Signed)
Patient notified VIA phone that RX was sent to [facility] today.  Dm/cma

## 2023-04-25 NOTE — Addendum Note (Signed)
Addended by: Waymond Cera on: 04/25/2023 03:14 PM   Modules accepted: Orders

## 2023-04-25 NOTE — Telephone Encounter (Signed)
RX sent to [facility] on 03/11/23, #90, 1 refill.    Left detailed VM to ask [facility] to get RX from them.  Dm/cma

## 2023-04-25 NOTE — Telephone Encounter (Signed)
RX sent to [facility]. Dm/cma

## 2023-04-25 NOTE — Telephone Encounter (Signed)
Pt called back and stated that we have the wrong script an ] d that he did not receive this med at all.    losartan (COZAAR) 100 MG tablet [[id number]]   He also stated that he uses [facility] not [facility].  He then stated I am coming up there tomorrow to straighten this out. I will bring the script with me.

## 2023-04-25 NOTE — Telephone Encounter (Signed)
Prescription Request  04/25/2023  LOV: 03/11/2023  What is the name of the medication or equipment? losartan (COZAAR) 100 MG tablet [[id number]]   Have you contacted your pharmacy to request a refill? He says he doesn't have a refill  Which pharmacy would you like this sent to?  [facility] [address] [place], [place] - [address] [facility] [address] [place]. [place] [address] Phone: [phone] Fax: [phone]  Patient notified that their request is being sent to the clinical staff for review and that they should receive a response within 2 business days.   Please advise at [facility] [phone]

## 2023-06-05 ENCOUNTER — Encounter: Payer: Self-pay | Admitting: Internal Medicine

## 2023-06-05 ENCOUNTER — Ambulatory Visit (INDEPENDENT_AMBULATORY_CARE_PROVIDER_SITE_OTHER): Payer: BC Managed Care – PPO | Admitting: Internal Medicine

## 2023-06-05 VITALS — BP 136/86 | HR 81 | Temp 98.7°F | Ht 73.0 in | Wt 208.0 lb

## 2023-06-05 DIAGNOSIS — Z125 Encounter for screening for malignant neoplasm of prostate: Secondary | ICD-10-CM

## 2023-06-05 DIAGNOSIS — Z0001 Encounter for general adult medical examination with abnormal findings: Secondary | ICD-10-CM | POA: Diagnosis not present

## 2023-06-05 DIAGNOSIS — K219 Gastro-esophageal reflux disease without esophagitis: Secondary | ICD-10-CM | POA: Diagnosis not present

## 2023-06-05 DIAGNOSIS — R7303 Prediabetes: Secondary | ICD-10-CM | POA: Diagnosis not present

## 2023-06-05 DIAGNOSIS — Z0000 Encounter for general adult medical examination without abnormal findings: Secondary | ICD-10-CM

## 2023-06-05 LAB — CBC WITH DIFFERENTIAL/PLATELET
Basophils Absolute: 0 10*3/uL (ref 0.0–0.1)
Basophils Relative: 0.4 % (ref 0.0–3.0)
Eosinophils Absolute: 0.3 10*3/uL (ref 0.0–0.7)
Eosinophils Relative: 3.6 % (ref 0.0–5.0)
HCT: 44.6 % (ref 39.0–52.0)
Hemoglobin: 14.4 g/dL (ref 13.0–17.0)
Lymphocytes Relative: 29.3 % (ref 12.0–46.0)
Lymphs Abs: 2.1 10*3/uL (ref 0.7–4.0)
MCHC: 32.2 g/dL (ref 30.0–36.0)
MCV: 88 fL (ref 78.0–100.0)
Monocytes Absolute: 0.6 10*3/uL (ref 0.1–1.0)
Monocytes Relative: 8.9 % (ref 3.0–12.0)
Neutro Abs: 4.2 10*3/uL (ref 1.4–7.7)
Neutrophils Relative %: 57.8 % (ref 43.0–77.0)
Platelets: 292 10*3/uL (ref 150.0–400.0)
RBC: 5.07 Mil/uL (ref 4.22–5.81)
RDW: 14.2 % (ref 11.5–15.5)
WBC: 7.3 10*3/uL (ref 4.0–10.5)

## 2023-06-05 LAB — COMPREHENSIVE METABOLIC PANEL
ALT: 19 U/L (ref 0–53)
AST: 17 U/L (ref 0–37)
Albumin: 4.3 g/dL (ref 3.5–5.2)
Alkaline Phosphatase: 55 U/L (ref 39–117)
BUN: 14 mg/dL (ref 6–23)
CO2: 27 meq/L (ref 19–32)
Calcium: 9.4 mg/dL (ref 8.4–10.5)
Chloride: 105 meq/L (ref 96–112)
Creatinine, Ser: 1.09 mg/dL (ref 0.40–1.50)
GFR: 77.1 mL/min (ref >60.00)
Glucose, Bld: 105 mg/dL — ABNORMAL HIGH (ref 70–99)
Potassium: 4.1 meq/L (ref 3.5–5.1)
Sodium: 140 meq/L (ref 135–145)
Total Bilirubin: 0.5 mg/dL (ref 0.2–1.2)
Total Protein: 6.9 g/dL (ref 6.0–8.3)

## 2023-06-05 LAB — LIPID PANEL
Cholesterol: 129 mg/dL (ref 0–200)
HDL: 69.9 mg/dL (ref >39.00)
LDL Cholesterol: 43 mg/dL (ref 0–99)
NonHDL: 59.09
Total CHOL/HDL Ratio: 2
Triglycerides: 78 mg/dL (ref 0.0–149.0)
VLDL: 15.6 mg/dL (ref 0.0–40.0)

## 2023-06-05 LAB — PSA: PSA: 1.72 ng/mL (ref 0.10–4.00)

## 2023-06-05 LAB — HEMOGLOBIN A1C: Hgb A1c MFr Bld: 6.1 % (ref 4.6–6.5)

## 2023-06-05 LAB — TSH: TSH: 1.26 u[IU]/mL (ref 0.35–5.50)

## 2023-06-05 MED ORDER — OMEPRAZOLE 40 MG PO CPDR: 40.0000 mg | DELAYED_RELEASE_CAPSULE | Freq: Every day | ORAL | 1 refills | Status: DC

## 2023-06-05 NOTE — Progress Notes (Signed)
Subjective:   Roger Monroe [birth year] 06/05/2023  CC: Chief Complaint  Patient presents with   Annual Exam    HPI: Roger Monroe is a 52 y.o. male who presents for a routine health maintenance exam.  Labs collected at time of visit. Last A1c in July 2023 did show the patient was in prediabetes range, we will check this today along with his other fasting lab work.   Patient complains of a burning sensation in his throat upon waking up first thing in the morning causing him to vomit.  This has been ongoing for a while.  He has not tried any medications over-the-counter.  He states he gets heartburn when eating tomato products or acidic foods.   HEALTH SCREENINGS: - PSA (50+): Ordered today  No results found for: "PSA1", "PSA"   - Colonoscopy (45+):  did cologuard in 2022, normal   - AAA Screening: Not applicable  Men age 30-75 who have ever smoked - Lung Cancer screening with low-dose CT: Not applicable Adults age 79-80 who are current cigarette smokers or quit within the last 15 years. Must have 20 pack year history.   Depression and Anxiety Screen done today and results listed below:     06/05/2023    9:00 AM 03/11/2023    2:08 PM 05/17/2015    8:29 AM  Depression screen PHQ 2/9  Decreased Interest 0 0 0  Down, Depressed, Hopeless 0 0 0  PHQ - 2 Score 0 0 0  Altered sleeping 1 0   Tired, decreased energy 0 0   Change in appetite 0 0   Feeling bad or failure about yourself  0 0   Trouble concentrating 0 0   Moving slowly or fidgety/restless 0 0   Suicidal thoughts 0 0   PHQ-9 Score 1 0   Difficult doing work/chores Somewhat difficult Not difficult at all       06/05/2023    9:00 AM 03/11/2023    2:08 PM  GAD 7 : Generalized Anxiety Score  Nervous, Anxious, on Edge 1 0  Control/stop worrying 1 0  Worry too much - different things 1 0  Trouble relaxing 0 0  Restless 0 0  Easily annoyed or irritable 0 0  Afraid - awful might happen 0 0  Total GAD 7 Score 3 0   Anxiety Difficulty Not difficult at all Not difficult at all    IMMUNIZATIONS:  - Tdap: Tetanus vaccination status reviewed: last tetanus booster within 10 years. - Influenza: Refused - Zostavax vaccine (50+): Refused   Past medical history, surgical history, medications, allergies, family history and social history reviewed with patient today and changes made to appropriate areas of the chart.   Past Medical History:  Diagnosis Date   Chronic pancreatitis (HCC)    Esophageal varices with bleeding (HCC)    PUD (peptic ulcer disease)     History reviewed. No pertinent surgical history.  Current Outpatient Medications on File Prior to Visit  Medication Sig   acetaminophen (TYLENOL) 500 MG tablet Take 1 tablet (500 mg total) by mouth every 6 (six) hours as needed for pain.   losartan (COZAAR) 100 MG tablet Take 1 tablet (100 mg total) by mouth daily.   Multiple Vitamins-Minerals (MULTIVITAMIN WITH MINERALS) tablet Take 1 tablet by mouth daily.   rosuvastatin (CRESTOR) 10 MG tablet Take 1 tablet (10 mg total) by mouth daily.   No current facility-administered medications on file prior to visit.    Allergies  Allergen Reactions  Ibuprofen     Constipation     Social History   Socioeconomic History   Marital status: Married    Spouse name: Not on file   Number of children: Not on file   Years of education: Not on file   Highest education level: Not on file  Occupational History   Not on file  Tobacco Use   Smoking status: Some Days    Types: Cigarettes   Smokeless tobacco: Never  Substance and Sexual Activity   Alcohol use: Yes    Alcohol/week: 4.0 standard drinks of alcohol    Types: 4 Cans of beer per week    Comment: Beer.   Drug use: Not Currently   Sexual activity: Not on file  Other Topics Concern   Not on file  Social History Narrative   Not on file   Social Determinants of Health   Financial Resource Strain: Not on file  Food Insecurity: No Food  Insecurity (01/05/2022)   Received from [facility]   Hunger Vital Sign    Worried About Running Out of Food in the Last Year: Never true    Ran Out of Food in the Last Year: Never true  Transportation Needs: Not on file  Physical Activity: Not on file  Stress: Not on file  Social Connections: Unknown (11/07/2021)   Received from [facility]   Social Network    Social Network: Not on file  Intimate Partner Violence: Unknown (09/29/2021)   Received from [facility]   HITS    Physically Hurt: Not on file    Insult or Talk Down To: Not on file    Threaten Physical Harm: Not on file    Scream or Curse: Not on file   Social History   Tobacco Use  Smoking Status Some Days   Types: Cigarettes  Smokeless Tobacco Never   Social History   Substance and Sexual Activity  Alcohol Use Yes   Alcohol/week: 4.0 standard drinks of alcohol   Types: 4 Cans of beer per week   Comment: Beer.     History reviewed. No pertinent family history.   ROS: Denies fever, fatigue, unexplained weight loss/gain, hearing or vision changes, cardiac or respiratory complaints. Denies neurological deficits, musculoskeletal complaints, genitourinary complaints, mental health complaints, and skin changes.   Objective:   Today's Vitals   06/05/23 0857  BP: 136/86  Pulse: 81  Temp: 98.7 F (37.1 C)  TempSrc: Temporal  SpO2: 98%  Weight: 208 lb (94.3 kg)  Height: 6\' 1"  (1.854 m)    GENERAL APPEARANCE: Well-appearing, in NAD. Well nourished.  SKIN: Pink, warm and dry. Turgor normal. No rash, lesion, ulceration, or ecchymoses. Hair evenly distributed.  HEENT: HEAD: Normocephalic.  EYES: PERRLA. EOMI. Lids intact w/o defect. Sclera white, Conjunctiva pink w/o exudate.  EARS: External ear w/o redness, swelling, masses or lesions. EAC clear. TM's intact, translucent w/o bulging, appropriate landmarks visualized. Appropriate acuity to conversational tones.  NOSE: Septum midline w/o deformity. Nares  patent, mucosa pink and non-inflamed w/o drainage. No sinus tenderness.  THROAT: Uvula midline. Oropharynx clear. Tonsils non-inflamed w/o exudate . Oral mucosa pink and moist.  NECK: Supple, Trachea midline. Full ROM w/o pain or tenderness. No lymphadenopathy. Thyroid non-tender w/o enlargement or palpable masses.  RESPIRATORY: Chest wall symmetrical w/o masses. Respirations even and non-labored. Breath sounds clear to auscultation bilaterally. No wheezes, rales, rhonchi, or crackles. CARDIAC: S1, S2 present, regular rate and rhythm. No gallops, murmurs, rubs, or clicks. No carotid bruits. Capillary  refill <2 seconds. Peripheral pulses 2+ bilaterally. GI: Abdomen soft w/o distention. Normoactive bowel sounds. No palpable masses or tenderness. No guarding or rebound tenderness. Liver and spleen w/o tenderness or enlargement. No CVA tenderness.  GU:  deferred exam. MSK: Muscle tone and strength appropriate for age, w/o atrophy or abnormal movement. EXTREMITIES: Active ROM intact, w/o tenderness, crepitus, or contracture. No obvious joint deformities or effusions. No clubbing, edema, or cyanosis.  NEUROLOGIC: CN's II-XII intact. Motor strength symmetrical with no obvious weakness. No sensory deficits. Steady, even gait.  PSYCH/MENTAL STATUS: Alert, oriented x 3. Cooperative, appropriate mood and affect.    Assessment & Plan:  Encounter for general adult medical examination without abnormal findings -     CBC with Differential/Platelet -     Comprehensive metabolic panel -     TSH -     Lipid panel -     PSA  Gastroesophageal reflux disease, unspecified whether esophagitis present -     Omeprazole; Take 1 capsule (40 mg total) by mouth daily.  Dispense: 90 capsule; Refill: 1  Prediabetes -     Hemoglobin A1c    Orders Placed This Encounter  Procedures   CBC with Differential/Platelet   Comprehensive metabolic panel   TSH   Lipid panel   PSA   Hemoglobin A1C    PATIENT  COUNSELING: - Encourage to adjust caloric intake to maintain or achieve ideal body weight, to reduce intake of dietary saturated fat and total fat, to limit sodium intake by avoiding high sodium foods and not adding table salt, and to maintain adequate dietary potassium and calcium preferably from fresh fruits, vegetables, and low-fat dairy products.   -  importance of regular exercise   NEXT PREVENTATIVE PHYSICAL DUE IN 1 YEAR.  Return in about 6 months (around 12/04/2023) for HTN, HLD, GERD.  Salvatore Decent, FNP

## 2023-08-14 NOTE — Progress Notes (Unsigned)
 [facility] [address] [place] [address] Dept: [phone] Dept Fax: [phone]    Subjective:   Roger Monroe 01-07-1971 08/15/2023  No chief complaint on file.   HPI: Roger Monroe presents today for re-assessment and management of chronic medical conditions.  Discussed the use of AI scribe software for clinical note transcription with the patient, who gave verbal consent to proceed.  History of Present Illness               The following portions of the patient's history were reviewed and updated as appropriate: past medical history, past surgical history, family history, social history, allergies, medications, and problem list.   Patient Active Problem List   Diagnosis Date Noted   Gastroesophageal reflux disease 06/05/2023   Prediabetes 06/05/2023   Hypertriglyceridemia 01/05/2022   Essential hypertension 11/04/2019   PUD (peptic ulcer disease) 05/17/2015   Tobacco abuse 05/17/2015   Past Medical History:  Diagnosis Date   Chronic pancreatitis (HCC)    Esophageal varices with bleeding (HCC)    PUD (peptic ulcer disease)    No past surgical history on file. No family history on file.  Current Outpatient Medications:    acetaminophen (TYLENOL) 500 MG tablet, Take 1 tablet (500 mg total) by mouth every 6 (six) hours as needed for pain., Disp: 30 tablet, Rfl: 0   losartan (COZAAR) 100 MG tablet, Take 1 tablet (100 mg total) by mouth daily., Disp: 90 tablet, Rfl: 1   Multiple Vitamins-Minerals (MULTIVITAMIN WITH MINERALS) tablet, Take 1 tablet by mouth daily., Disp: , Rfl:    omeprazole (PRILOSEC) 40 MG capsule, Take 1 capsule (40 mg total) by mouth daily., Disp: 90 capsule, Rfl: 1   rosuvastatin (CRESTOR) 10 MG tablet, Take 1 tablet (10 mg total) by mouth daily., Disp: 90 tablet, Rfl: 1 Allergies  Allergen Reactions   Ibuprofen     Constipation     ROS: A complete ROS was performed with  pertinent positives/negatives noted in the HPI. The remainder of the ROS are negative.    Objective:   There were no vitals filed for this visit.  GENERAL: Well-appearing, in NAD. Well nourished.  SKIN: Pink, warm and dry. No rash, lesion, ulceration, or ecchymoses.  NECK: Trachea midline. Full ROM w/o pain or tenderness. No lymphadenopathy.  RESPIRATORY: Chest wall symmetrical. Respirations even and non-labored. Breath sounds clear to auscultation bilaterally.  CARDIAC: S1, S2 present, regular rate and rhythm. Peripheral pulses 2+ bilaterally.  MSK: Muscle tone and strength appropriate for age. Joints w/o tenderness, redness, or swelling.  EXTREMITIES: Without clubbing, cyanosis, or edema.  NEUROLOGIC: No motor or sensory deficits. Steady, even gait.  PSYCH/MENTAL STATUS: Alert, oriented x 3. Cooperative, appropriate mood and affect.   Health Maintenance Due  Topic Date Due   Zoster Vaccines- Shingrix (1 of 2) Never done   Pneumococcal Vaccine 109-66 Years old (2 of 2 - PCV) 11/18/2020    No results found for any visits on 08/15/23.  The ASCVD Risk score (Arnett DK, et al., 2019) failed to calculate for the following reasons:   The valid total cholesterol range is 130 to 320 mg/dL     Assessment & Plan:  Assessment and Plan              There are no diagnoses linked to this encounter. No orders of the defined types were placed in this encounter.  No images are attached to the encounter or orders placed in the encounter. No  orders of the defined types were placed in this encounter.   No follow-ups on file.   Salvatore Decent, FNP

## 2023-08-15 ENCOUNTER — Encounter: Payer: Self-pay | Admitting: Internal Medicine

## 2023-08-15 ENCOUNTER — Ambulatory Visit: Payer: BC Managed Care – PPO | Admitting: Internal Medicine

## 2023-08-15 VITALS — BP 136/84 | HR 80 | Temp 98.3°F | Ht 73.0 in | Wt 211.8 lb

## 2023-08-15 DIAGNOSIS — I10 Essential (primary) hypertension: Secondary | ICD-10-CM

## 2023-08-15 DIAGNOSIS — E781 Pure hyperglyceridemia: Secondary | ICD-10-CM

## 2023-08-15 DIAGNOSIS — K219 Gastro-esophageal reflux disease without esophagitis: Secondary | ICD-10-CM | POA: Diagnosis not present

## 2023-08-15 DIAGNOSIS — Z1211 Encounter for screening for malignant neoplasm of colon: Secondary | ICD-10-CM | POA: Diagnosis not present

## 2023-08-15 DIAGNOSIS — R7303 Prediabetes: Secondary | ICD-10-CM

## 2023-08-15 DIAGNOSIS — Z720 Tobacco use: Secondary | ICD-10-CM

## 2023-08-15 MED ORDER — ESOMEPRAZOLE MAGNESIUM 40 MG PO CPDR: 40.0000 mg | DELAYED_RELEASE_CAPSULE | Freq: Every day | ORAL | 1 refills | Status: DC

## 2023-08-15 NOTE — Patient Instructions (Signed)
 Keep log of foods to see if any are triggering symptoms  If no improvement on new medication, notify me and we will refer you to gastroenterology

## 2023-12-09 ENCOUNTER — Encounter: Payer: Self-pay | Admitting: Internal Medicine

## 2023-12-23 ENCOUNTER — Ambulatory Visit: Admitting: Internal Medicine

## 2024-01-08 ENCOUNTER — Encounter: Payer: Self-pay | Admitting: Internal Medicine

## 2024-02-10 ENCOUNTER — Encounter: Payer: Self-pay | Admitting: Internal Medicine

## 2024-02-10 ENCOUNTER — Ambulatory Visit: Admitting: Internal Medicine

## 2024-02-10 VITALS — BP 140/94 | HR 79 | Temp 98.5°F | Ht 73.0 in | Wt 213.6 lb

## 2024-02-10 DIAGNOSIS — I10 Essential (primary) hypertension: Secondary | ICD-10-CM | POA: Diagnosis not present

## 2024-02-10 DIAGNOSIS — M545 Low back pain, unspecified: Secondary | ICD-10-CM | POA: Diagnosis not present

## 2024-02-10 DIAGNOSIS — K219 Gastro-esophageal reflux disease without esophagitis: Secondary | ICD-10-CM

## 2024-02-10 DIAGNOSIS — R7303 Prediabetes: Secondary | ICD-10-CM

## 2024-02-10 LAB — POCT GLYCOSYLATED HEMOGLOBIN (HGB A1C): Hemoglobin A1C: 5.9 % — AB (ref 4.0–5.6)

## 2024-02-10 MED ORDER — AMLODIPINE BESYLATE 5 MG PO TABS: 5.0000 mg | ORAL_TABLET | Freq: Every day | ORAL | 1 refills | Status: AC

## 2024-02-10 MED ORDER — LOSARTAN POTASSIUM 100 MG PO TABS: 100.0000 mg | ORAL_TABLET | Freq: Every day | ORAL | 1 refills | Status: AC

## 2024-02-10 NOTE — Progress Notes (Signed)
 [facility] [address] [place] [address] Dept: [phone] Dept Fax: [phone]    Subjective:   Roger Monroe September 30, 1970 02/10/2024  Chief Complaint  Patient presents with   Follow-up   Referral    GI-  acid reflux    Back Pain    Ongoing work related     HPI: Roger Monroe presents today for re-assessment and management of chronic medical conditions.  Discussed the use of AI scribe software for clinical note transcription with the patient, who gave verbal consent to proceed.  History of Present Illness   Roger Monroe is a 53 year old male with prediabetes and hypertension who presents for follow-up on blood pressure management and prediabetes.  His most recent HbA1c was 5.9%, a decrease from 6.1% last year.   He uses losartan  for hypertension. He has also been trying alternative methods such as beet juice and beet gummies to manage his blood pressure. He reports watching sodium intake. He states his blood pressure is typically higher when he is less active. Home BP readings around 140/90 mmHg  He experiences back pain attributed to work-related strain, localized to the lower back without radiation down his legs. He manages it with ice, heat, and rest, and does not regularly take pain medication, stating 'pain with me, it has to be very intense.' Does not want to be evaluated for this today.   He describes ongoing symptoms of acid reflux, including nausea and a dry cough, persisting despite dietary changes. Nexium  helps reduce some symptoms but does not alleviate nausea. Nausea occurs primarily in the morning and improves after eating. He avoids acidic foods , tries to avoid greasy/foods. He has not taken Nexium  for a couple of weeks as it was not effective. No blood in stool or dark tarry stools. No abdominal pain.   He is awaiting results from a Cologuard test and is UTD on tetanus and pneumonia vaccines. He is  considering the shingles vaccine but is concerned about potential side effects.       A1C Lab Results  Component Value Date   HGBA1C 5.9 (A) 02/10/2024   HGBA1C 6.1 06/05/2023   Last metabolic panel Lab Results  Component Value Date   GLUCOSE 105 (H) 06/05/2023   NA 140 06/05/2023   K 4.1 06/05/2023   CL 105 06/05/2023   CO2 27 06/05/2023   BUN 14 06/05/2023   CREATININE 1.09 06/05/2023   GFR 77.10 06/05/2023   CALCIUM  9.4 06/05/2023   PROT 6.9 06/05/2023   ALBUMIN 4.3 06/05/2023   LABGLOB 2.5 05/17/2015   AGRATIO 1.6 05/17/2015   BILITOT 0.5 06/05/2023   ALKPHOS 55 06/05/2023   AST 17 06/05/2023   ALT 19 06/05/2023    The following portions of the patient's history were reviewed and updated as appropriate: past medical history, past surgical history, family history, social history, allergies, medications, and problem list.   Patient Active Problem List   Diagnosis Date Noted   Gastroesophageal reflux disease 06/05/2023   Prediabetes 06/05/2023   Hypertriglyceridemia 01/05/2022   Essential hypertension 11/04/2019   PUD (peptic ulcer disease) 05/17/2015   Tobacco abuse 05/17/2015   Past Medical History:  Diagnosis Date   Chronic pancreatitis (HCC)    Esophageal varices with bleeding (HCC)    PUD (peptic ulcer disease)    History reviewed. No pertinent surgical history. History reviewed. No pertinent family history.  Current Outpatient Medications:    amLODipine  (NORVASC ) 5 MG tablet, Take 1  tablet (5 mg total) by mouth daily., Disp: 90 tablet, Rfl: 1   Multiple Vitamins-Minerals (MULTIVITAMIN WITH MINERALS) tablet, Take 1 tablet by mouth daily., Disp: , Rfl:    rosuvastatin  (CRESTOR ) 10 MG tablet, Take 1 tablet (10 mg total) by mouth daily., Disp: 90 tablet, Rfl: 1   esomeprazole  (NEXIUM ) 40 MG capsule, Take 1 capsule (40 mg total) by mouth daily at 12 noon. (Patient not taking: Reported on 02/10/2024), Disp: 90 capsule, Rfl: 1   losartan  (COZAAR ) 100 MG tablet,  Take 1 tablet (100 mg total) by mouth daily., Disp: 90 tablet, Rfl: 1 Allergies  Allergen Reactions   Ibuprofen     Constipation     ROS: A complete ROS was performed with pertinent positives/negatives noted in the HPI. The remainder of the ROS are negative.    Objective:   Today's Vitals   02/10/24 1414 02/10/24 1443  BP: (!) 146/98 (!) 140/94  Pulse: 79   Temp: 98.5 F (36.9 C)   TempSrc: Temporal   SpO2: 98%   Weight: 213 lb 9.6 oz (96.9 kg)   Height: 6' 1 (1.854 m)     GENERAL: Well-appearing, in NAD. Well nourished.  SKIN: Pink, warm and dry. No rash, lesion, ulceration, or ecchymoses.  NECK: Trachea midline. Full ROM w/o pain or tenderness. No lymphadenopathy. No thyromegaly or palpable masses.  RESPIRATORY: Chest wall symmetrical. Respirations even and non-labored. Breath sounds clear to auscultation bilaterally. No carotid bruits.  CARDIAC: S1, S2 present, regular rate and rhythm. Peripheral pulses 2+ bilaterally.  EXTREMITIES: Without clubbing, cyanosis, or edema.  NEUROLOGIC: Steady, even gait.  PSYCH/MENTAL STATUS: Alert, oriented x 3. Cooperative, appropriate mood and affect.   Health Maintenance Due  Topic Date Due   Hepatitis B Vaccines 19-59 Average Risk (1 of 3 - 19+ 3-dose series) Never done   Fecal DNA (Cologuard)  12/13/2023    Results for orders placed or performed in visit on 02/10/24  POCT glycosylated hemoglobin (Hb A1C)  Result Value Ref Range   Hemoglobin A1C 5.9 (A) 4.0 - 5.6 %   HbA1c POC (<> result, manual entry)     HbA1c, POC (prediabetic range)     HbA1c, POC (controlled diabetic range)      The ASCVD Risk score (Arnett DK, et al., 2019) failed to calculate for the following reasons:   The valid total cholesterol range is 130 to 320 mg/dL     Assessment & Plan:  Assessment and Plan    Hypertension Hypertension with home readings in 140s/90s. - Continue losartan  100 mg once daily. - Prescribe amlodipine  5 mg once daily, advise  nighttime dosing if ankle swelling occurs. - Advise home blood pressure monitoring. If remains 140/90s despite additional BP medication, return to clinic.   Gastroesophageal reflux disease  Chronic nausea and GERD not controlled with Nexium . - Perform H. pylori breath test. - If positive, initiate h pylori tx quad regimen - If negative, consider GI referral.  Prediabetes Prediabetes with HbA1c of 5.9%, improved from 6.1% due to diet and exercise.  - Monitor HbA1c at routine visits. - Continue diet and exercise regimen.  Low back pain, likely musculoskeletal Intermittent low back pain likely musculoskeletal from work. No radicular symptoms. Prefers non-pharmacological management. - Advise use of ice and heat. - Recommend stretching exercises. - Tylenol  PRN      Orders Placed This Encounter  Procedures   H. pylori breath test   POCT glycosylated hemoglobin (Hb A1C)   No images are attached to the  encounter or orders placed in the encounter. Meds ordered this encounter  Medications   amLODipine  (NORVASC ) 5 MG tablet    Sig: Take 1 tablet (5 mg total) by mouth daily.    Dispense:  90 tablet    Refill:  1    Supervising Provider:   THOMPSON, AARON B [[id number]]   losartan  (COZAAR ) 100 MG tablet    Sig: Take 1 tablet (100 mg total) by mouth daily.    Dispense:  90 tablet    Refill:  1    Supervising Provider:   THOMPSON, AARON B [[id number]]    Return in about 4 months (around 06/11/2024) for Annual Physical Exam with fasting lab work.   Roger Senters, FNP

## 2024-02-10 NOTE — Patient Instructions (Signed)
  VISIT SUMMARY: Today, we discussed your blood pressure management, prediabetes, back pain, and ongoing symptoms of acid reflux. We reviewed your recent lab results and current medications, and we made some adjustments to your treatment plan to better manage your conditions.  YOUR PLAN: -HYPERTENSION: Hypertension means high blood pressure. Your home readings are around 140/90 mmHg, and you have been taking losartan  inconsistently. We recommend you continue taking losartan  100 mg once daily and start taking amlodipine  5 mg once daily. If you experience ankle swelling, take amlodipine  at night. Please monitor your blood pressure at home and we will recheck it in the office.  -GASTROESOPHAGEAL REFLUX DISEASE (GERD) WITH CHRONIC NAUSEA: GERD is a condition where stomach acid frequently flows back into the tube connecting your mouth and stomach, causing discomfort. You have been experiencing chronic nausea and acid reflux. We will perform an H. pylori breath test to check for a bacterial infection that can cause ulcers. If the test is positive, we will start you on antibiotics and Pepto Bismol. If negative, we may refer you to a gastrointestinal specialist.  -PREDIABETES: Prediabetes means your blood sugar levels are higher than normal but not high enough to be classified as diabetes. Your HbA1c has improved to 5.9% from 6.1% last year due to your diet and exercise. Continue with your current diet and exercise regimen, and we will monitor your HbA1c during routine visits.  -LOW BACK PAIN, LIKELY MUSCULOSKELETAL: Your back pain is likely due to muscle strain from work. It is not radiating down your legs and you prefer non-drug treatments. We recommend using ice and heat, doing stretching exercises, and considering naproxen  if the pain becomes intense.  INSTRUCTIONS: Continue monitoring your blood pressure at home. If remains 140/90 despite blood pressure medication, please follow up sooner in office before  your scheduled office visit. We will also follow up on the results of your H. pylori breath test and adjust your treatment plan accordingly. Keep up with your diet and exercise to manage your prediabetes, and use ice, heat, and stretching exercises for your back pain. Consider taking naproxen  if needed for pain relief.                      Contains text generated by Abridge.                                 Contains text generated by Abridge.

## 2024-02-11 LAB — H. PYLORI BREATH TEST: H. pylori Breath Test: DETECTED — AB

## 2024-02-12 ENCOUNTER — Ambulatory Visit: Payer: Self-pay | Admitting: Internal Medicine

## 2024-02-12 DIAGNOSIS — A048 Other specified bacterial intestinal infections: Secondary | ICD-10-CM

## 2024-02-12 LAB — COLOGUARD: COLOGUARD: NEGATIVE

## 2024-02-12 MED ORDER — BISMUTH/METRONIDAZ/TETRACYCLIN 140-125-125 MG PO CAPS: 3.0000 | ORAL_CAPSULE | Freq: Four times a day (QID) | ORAL | 0 refills | Status: AC

## 2024-02-12 MED ORDER — OMEPRAZOLE 20 MG PO CPDR: 20.0000 mg | DELAYED_RELEASE_CAPSULE | Freq: Two times a day (BID) | ORAL | 0 refills | Status: AC

## 2024-02-13 ENCOUNTER — Ambulatory Visit: Payer: Self-pay | Admitting: Internal Medicine

## 2024-03-04 ENCOUNTER — Ambulatory Visit: Admitting: Internal Medicine

## 2024-03-23 ENCOUNTER — Telehealth: Payer: Self-pay

## 2024-03-23 NOTE — Telephone Encounter (Signed)
 Called patient and got him scheduled for an office appointment.

## 2024-03-23 NOTE — Telephone Encounter (Signed)
 Copied from CRM #[id number]. Topic: Clinical - Medication Question >> Mar 23, 2024  9:07 AM Burnard DEL wrote: Reason for CRM: Patient called in stating that since he has been taking Bismuth /Metronidaz/Tetracyclin (PYLERA) 140-125-125 MG CAPS (Expired) he has broken into a rash under his arms. He stated that the rash is a purple color. He also stated that he feels worse taking the medication than when he doesn't,as far as being nauseous. He would like to know what Rosina would like for him to do? Please advise. Patient stated that he will be on a plane at 3,so if he can't be reached please leave a message.

## 2024-03-23 NOTE — Telephone Encounter (Signed)
 I will need to discuss this with him at appt on 9/30

## 2024-03-24 ENCOUNTER — Ambulatory Visit: Admitting: Internal Medicine

## 2024-03-25 ENCOUNTER — Encounter: Payer: Self-pay | Admitting: Internal Medicine

## 2024-03-25 ENCOUNTER — Ambulatory Visit: Admitting: Internal Medicine

## 2024-03-25 VITALS — BP 136/84 | HR 90 | Temp 97.7°F | Ht 74.0 in | Wt 207.8 lb

## 2024-03-25 DIAGNOSIS — A048 Other specified bacterial intestinal infections: Secondary | ICD-10-CM | POA: Diagnosis not present

## 2024-03-25 DIAGNOSIS — L309 Dermatitis, unspecified: Secondary | ICD-10-CM | POA: Diagnosis not present

## 2024-03-25 MED ORDER — TRIAMCINOLONE ACETONIDE 0.1 % EX CREA: 1.0000 | TOPICAL_CREAM | Freq: Two times a day (BID) | CUTANEOUS | 1 refills | Status: AC

## 2024-03-25 NOTE — Progress Notes (Unsigned)
 [facility]IMARY [facility] [address] [place] [address] Dept: [phone] Dept Fax: [phone]  Acute Care Office Visit  Subjective:   Roger Monroe [birth year] 03/25/2024  Chief Complaint  Patient presents with   Rash    Since Saturday 9/27 Itchy under arm pit using benadryl cream    HPI:  Rash to bilateral arm pits onset ?? Saw it on Saturday after shower. No changes in deodorant. Associated itching - benadryl cream used, seemed to help.   The following portions of the patient's history were reviewed and updated as appropriate: past medical history, past surgical history, family history, social history, allergies, medications, and problem list.   Patient Active Problem List   Diagnosis Date Noted   Gastroesophageal reflux disease 06/05/2023   Prediabetes 06/05/2023   Hypertriglyceridemia 01/05/2022   Essential hypertension 11/04/2019   PUD (peptic ulcer disease) 05/17/2015   Tobacco abuse 05/17/2015   Past Medical History:  Diagnosis Date   Chronic pancreatitis (HCC)    Esophageal varices with bleeding (HCC)    PUD (peptic ulcer disease)    History reviewed. No pertinent surgical history. History reviewed. No pertinent family history.  Current Outpatient Medications:    amLODipine  (NORVASC ) 5 MG tablet, Take 1 tablet (5 mg total) by mouth daily., Disp: 90 tablet, Rfl: 1   losartan  (COZAAR ) 100 MG tablet, Take 1 tablet (100 mg total) by mouth daily., Disp: 90 tablet, Rfl: 1   Multiple Vitamins-Minerals (MULTIVITAMIN WITH MINERALS) tablet, Take 1 tablet by mouth daily., Disp: , Rfl:    rosuvastatin  (CRESTOR ) 10 MG tablet, Take 1 tablet (10 mg total) by mouth daily., Disp: 90 tablet, Rfl: 1   Bismuth /Metronidaz/Tetracyclin (PYLERA) 140-125-125 MG CAPS, Take 3 capsules by mouth in the morning, at noon, in the evening, and at bedtime for 10 days., Disp: 120 capsule, Rfl: 0   omeprazole  (PRILOSEC) 20 MG capsule, Take 1  capsule (20 mg total) by mouth 2 (two) times daily before a meal for 10 days. (Patient not taking: Reported on 03/25/2024), Disp: 20 capsule, Rfl: 0 Allergies  Allergen Reactions   Ibuprofen     Constipation     ROS: A complete ROS was performed with pertinent positives/negatives noted in the HPI. The remainder of the ROS are negative.    Objective:   Today's Vitals   03/25/24 1540  BP: 136/84  Pulse: 90  Temp: 97.7 F (36.5 C)  TempSrc: Temporal  SpO2: 96%  Weight: 207 lb 12.8 oz (94.3 kg)  Height: 6' 2 (1.88 m)    GENERAL: Well-appearing, in NAD. Well nourished.  SKIN: Pink, warm and dry. No rash, lesion, ulceration, or ecchymoses.  NECK: Trachea midline. Full ROM w/o pain or tenderness. No lymphadenopathy.  RESPIRATORY: Chest wall symmetrical. Respirations even and non-labored. Breath sounds clear to auscultation bilaterally.  CARDIAC: S1, S2 present, regular rate and rhythm. Peripheral pulses 2+ bilaterally.  MSK: Muscle tone and strength appropriate for age. Joints w/o tenderness, redness, or swelling. EXTREMITIES: Without clubbing, cyanosis, or edema.  NEUROLOGIC: No motor or sensory deficits. Steady, even gait.  PSYCH/MENTAL STATUS: Alert, oriented x 3. Cooperative, appropriate mood and affect.    No results found for any visits on 03/25/24.    Assessment & Plan:     There are no diagnoses linked to this encounter. No orders of the defined types were placed in this encounter.  No orders of the defined types were placed in this encounter.  Lab Orders  No laboratory test(s) ordered today  No images are attached to the encounter or orders placed in the encounter.  No follow-ups on file.   Rosina Senters, FNP

## 2024-03-25 NOTE — Patient Instructions (Signed)
 Armpit rash:  Over the counter: Zyrtec (Cetirizine) 10mg . Take 1 tablet once daily for 2 weeks.  Use steroid cream as prescribed

## 2024-03-26 LAB — H. PYLORI BREATH TEST: H. pylori Breath Test: NOT DETECTED

## 2024-03-27 ENCOUNTER — Ambulatory Visit: Payer: Self-pay | Admitting: Internal Medicine

## 2024-06-04 ENCOUNTER — Ambulatory Visit: Admitting: Nurse Practitioner

## 2024-06-04 NOTE — Progress Notes (Deleted)
 06/04/2024 Roger Monroe [id number] 19-Aug-1970   CHIEF COMPLAINT:   HISTORY OF PRESENT ILLNESS: ***   Discussed the use of AI scribe software for clinical note transcription with the patient, who gave verbal consent to proceed.  History of Present Illness         Latest Ref Rng & Units 06/05/2023    9:31 AM 03/19/2023    8:11 AM 05/17/2015    9:12 AM  CBC  WBC 4.0 - 10.5 K/uL 7.3  7.0  12.8   Hemoglobin 13.0 - 17.0 g/dL 85.5  84.7  83.8   Hematocrit 39.0 - 52.0 % 44.6  47.2  48.8   Platelets 150.0 - 400.0 K/uL 292.0  310.0  340        Latest Ref Rng & Units 06/05/2023    9:31 AM 03/19/2023    8:11 AM 05/17/2015    9:12 AM  CMP  Glucose 70 - 99 mg/dL 894  89  88   BUN 6 - 23 mg/dL 14  18  9    Creatinine 0.40 - 1.50 mg/dL 8.90  8.72  8.97   Sodium 135 - 145 mEq/L 140  140  142   Potassium 3.5 - 5.1 mEq/L 4.1  4.2  4.8   Chloride 96 - 112 mEq/L 105  105  103   CO2 19 - 32 mEq/L 27  24  24    Calcium  8.4 - 10.5 mg/dL 9.4  9.4  9.5   Total Protein 6.0 - 8.3 g/dL 6.9  6.7  6.6   Total Bilirubin 0.2 - 1.2 mg/dL 0.5  0.3  0.3   Alkaline Phos 39 - 117 U/L 55  60  66   AST 0 - 37 U/L 17  17  14    ALT 0 - 53 U/L 19  18  14      H. Pylori breath test positive 02/10/2024 treated with Pylera and Omeprazole   H. Pylori breath test 03/25/2024 was negative   Cologuard negative 02/05/2024  Past Medical History:  Diagnosis Date   Chronic pancreatitis (HCC)    Esophageal varices with bleeding (HCC)    PUD (peptic ulcer disease)    No past surgical history on file. Social History:  Family History:    reports that he has been smoking cigarettes. He has never used smokeless tobacco. He reports current alcohol use of about 4.0 standard drinks of alcohol per week. He reports that he does not currently use drugs. family history is not on file. Allergies[1]    Outpatient Encounter Medications as of 06/04/2024  Medication Sig   amLODipine  (NORVASC ) 5 MG tablet Take 1  tablet (5 mg total) by mouth daily.   Bismuth /Metronidaz/Tetracyclin (PYLERA) 140-125-125 MG CAPS Take 3 capsules by mouth in the morning, at noon, in the evening, and at bedtime for 10 days.   losartan  (COZAAR ) 100 MG tablet Take 1 tablet (100 mg total) by mouth daily.   Multiple Vitamins-Minerals (MULTIVITAMIN WITH MINERALS) tablet Take 1 tablet by mouth daily.   omeprazole  (PRILOSEC) 20 MG capsule Take 1 capsule (20 mg total) by mouth 2 (two) times daily before a meal for 10 days. (Patient not taking: Reported on 03/25/2024)   rosuvastatin  (CRESTOR ) 10 MG tablet Take 1 tablet (10 mg total) by mouth daily.   triamcinolone  cream (KENALOG ) 0.1 % Apply 1 Application topically 2 (two) times daily.   No facility-administered encounter medications on file as of 06/04/2024.     REVIEW OF SYSTEMS:  Gen: Denies fever,  sweats or chills. No weight loss.  CV: Denies chest pain, palpitations or edema. Resp: Denies cough, shortness of breath of hemoptysis.  GI: Denies heartburn, dysphagia, stomach or lower abdominal pain. No diarrhea or constipation.  GU: Denies urinary burning, blood in urine, increased urinary frequency or incontinence. MS: Denies joint pain, muscles aches or weakness. Derm: Denies rash, itchiness, skin lesions or unhealing ulcers. Psych: Denies depression, anxiety, memory loss or confusion. Heme: Denies bruising, easy bleeding. Neuro:  Denies headaches, dizziness or paresthesias. Endo:  Denies any problems with DM, thyroid or adrenal function.  PHYSICAL EXAM: There were no vitals taken for this visit. General: in no acute distress. Head: Normocephalic and atraumatic. Eyes:  Sclerae non-icteric, conjunctive pink. Ears: Normal auditory acuity. Mouth: Dentition intact. No ulcers or lesions.  Neck: Supple, no lymphadenopathy or thyromegaly.  Lungs: Clear bilaterally to auscultation without wheezes, crackles or rhonchi. Heart: Regular rate and rhythm. No murmur, rub or gallop  appreciated.  Abdomen: Soft, nontender, nondistended. No masses. No hepatosplenomegaly. Normoactive bowel sounds x 4 quadrants.  Rectal:  Musculoskeletal: Symmetrical with no gross deformities. Skin: Warm and dry. No rash or lesions on visible extremities. Extremities: No edema. Neurological: Alert oriented x 4, no focal deficits.  Psychological: Alert and cooperative. Normal mood and affect.  ASSESSMENT AND PLAN:    CC:  Billy Knee, FNP       [1]  Allergies Allergen Reactions   Ibuprofen     Constipation
